# Patient Record
Sex: Female | Born: 1980
Health system: Southern US, Community
[De-identification: ages and names within clinical notes are randomized; demographics above are authoritative.]

## PROBLEM LIST (undated history)

## (undated) DIAGNOSIS — F32A Depression, unspecified: Secondary | ICD-10-CM

## (undated) DIAGNOSIS — R42 Dizziness and giddiness: Secondary | ICD-10-CM

## (undated) DIAGNOSIS — F319 Bipolar disorder, unspecified: Secondary | ICD-10-CM

## (undated) DIAGNOSIS — G4452 New daily persistent headache (NDPH): Secondary | ICD-10-CM

## (undated) DIAGNOSIS — F329 Major depressive disorder, single episode, unspecified: Secondary | ICD-10-CM

## (undated) HISTORY — PX: WISDOM TOOTH EXTRACTION: SHX21

## (undated) HISTORY — PX: APPENDECTOMY: SHX54

## (undated) HISTORY — DX: Bipolar disorder, unspecified: F31.9

## (undated) HISTORY — DX: Major depressive disorder, single episode, unspecified: F32.9

## (undated) HISTORY — DX: Dizziness and giddiness: R42

## (undated) HISTORY — DX: New daily persistent headache (ndph): G44.52

## (undated) HISTORY — DX: Depression, unspecified: F32.A

---

## 2012-12-01 ENCOUNTER — Emergency Department (HOSPITAL_COMMUNITY): Payer: BC Managed Care – PPO

## 2012-12-01 ENCOUNTER — Emergency Department (HOSPITAL_COMMUNITY)
Admission: EM | Admit: 2012-12-01 | Discharge: 2012-12-01 | Disposition: A | Discharge status: Home / Self Care | Payer: BC Managed Care – PPO | Attending: Emergency Medicine | Admitting: Emergency Medicine

## 2012-12-01 DIAGNOSIS — Z880 Allergy status to penicillin: Secondary | ICD-10-CM | POA: Insufficient documentation

## 2012-12-01 DIAGNOSIS — Z8739 Personal history of other diseases of the musculoskeletal system and connective tissue: Secondary | ICD-10-CM | POA: Insufficient documentation

## 2012-12-01 DIAGNOSIS — R55 Syncope and collapse: Secondary | ICD-10-CM

## 2012-12-01 DIAGNOSIS — F411 Generalized anxiety disorder: Secondary | ICD-10-CM | POA: Insufficient documentation

## 2012-12-01 DIAGNOSIS — Z79899 Other long term (current) drug therapy: Secondary | ICD-10-CM | POA: Insufficient documentation

## 2012-12-01 LAB — COMPREHENSIVE METABOLIC PANEL
AST: 18 U/L (ref 0–37)
Albumin: 4.7 g/dL (ref 3.5–5.2)
Alkaline Phosphatase: 55 U/L (ref 39–117)
BUN: 8 mg/dL (ref 6–23)
CO2: 24 mEq/L (ref 19–32)
Chloride: 103 mEq/L (ref 96–112)
Creatinine, Ser: 0.7 mg/dL (ref 0.50–1.10)
GFR calc non Af Amer: >90 mL/min (ref >90)
Potassium: 4.1 mEq/L (ref 3.5–5.1)
Total Bilirubin: 0.5 mg/dL (ref 0.3–1.2)

## 2012-12-01 LAB — URINALYSIS, ROUTINE W REFLEX MICROSCOPIC
Bilirubin Urine: NEGATIVE
Glucose, UA: NEGATIVE mg/dL
Hgb urine dipstick: NEGATIVE
Ketones, ur: 15 mg/dL — AB
Protein, ur: NEGATIVE mg/dL
pH: 7.5 (ref 5.0–8.0)

## 2012-12-01 LAB — CBC WITH DIFFERENTIAL/PLATELET
Basophils Absolute: 0 10*3/uL (ref 0.0–0.1)
Basophils Relative: 0 % (ref 0–1)
Eosinophils Relative: 0 % (ref 0–5)
HCT: 43.1 % (ref 36.0–46.0)
Hemoglobin: 15 g/dL (ref 12.0–15.0)
MCH: 27.9 pg (ref 26.0–34.0)
MCHC: 34.8 g/dL (ref 30.0–36.0)
MCV: 80.3 fL (ref 78.0–100.0)
Monocytes Absolute: 0.6 10*3/uL (ref 0.1–1.0)
Monocytes Relative: 6 % (ref 3–12)
Neutro Abs: 8 10*3/uL — ABNORMAL HIGH (ref 1.7–7.7)
RDW: 13.2 % (ref 11.5–15.5)

## 2012-12-01 LAB — T3, FREE: T3, Free: 3.6 pg/mL (ref 2.3–4.2)

## 2012-12-01 LAB — POCT I-STAT TROPONIN I: Troponin i, poc: 0.01 ng/mL (ref 0.00–0.08)

## 2012-12-01 MED ORDER — SODIUM CHLORIDE 0.9 % IV BOLUS (SEPSIS)
1000.0000 mL | Freq: Once | INTRAVENOUS | Status: AC
  Administered 2012-12-01: 1000 mL via INTRAVENOUS

## 2012-12-01 NOTE — ED Notes (Addendum)
Pt reports "passing out" this morning. Pt does not remember events of this morning. Pt husbands reports her waking up at 5am and pt does not remember events after waking up. Husband states that she was normal prior to 0615 when he left. Pt unsure if she hit her back or head. Pt does report some upper back pain. Pt states that she generally does not feel well now.

## 2012-12-01 NOTE — ED Provider Notes (Signed)
History     CSN: [id number]  Arrival date & time 12/01/12  4098   First MD Initiated Contact with Patient 12/01/12 0935      Chief Complaint  Patient presents with  . Loss of Consciousness    (Consider location/radiation/quality/duration/timing/severity/associated sxs/prior treatment) HPI  Tina Solis is a 32 y.o.female presenting to the ER with complaints of loss of consciousness. Her story is vague and complex. Her husband woke her up at 5:00am this morning, she started to get ready and last thing she remembers is doing her hair. Husband says he left for work around 6:20am and she was fine. The grandmother came over to pick up the kids and take them to school when their 84 year old told the grandma "mom spun around in circles and then fell down and I can't wake her up". The grandma called the husband to come home. At the time the next thing the husband or the patient can tell me is she called her husband at 7am and doesn't remember anything before going to bed last night. She denies any significant past medical history aside from starting treatment for her thyroid within the past month. She otherwise is healthy. She is tearful. The husband and patient deny psych history or history of anything like this happening before. Has had deep bone aches the last couple of months with weakness. Patient is currently awake and oriented. She is at baseline. No chest pain, n/v/d/fevers, head or neck injury, no focal weakness.   No past medical history on file.  No past surgical history on file.  No family history on file.  History  Substance Use Topics  . Smoking status: Not on file  . Smokeless tobacco: Not on file  . Alcohol Use: Not on file    OB History   No data available      Review of Systems  All other systems reviewed and are negative.   HPI negative unless as stated in HPI.   Allergies  Penicillins  Home Medications   Current Outpatient Rx  Name  Route  Sig  Dispense   Refill  . ALPRAZolam (XANAX) 1 MG tablet   Oral   Take 1 mg by mouth 2 (two) times daily as needed for sleep.         . methimazole (TAPAZOLE) 5 MG tablet   Oral   Take 5 mg by mouth 2 (two) times daily.           BP 113/77  Pulse 68  Temp(Src) 98.1 F (36.7 C) (Oral)  Resp 15  SpO2 100%  LMP 11/07/2012  Physical Exam  Nursing note and vitals reviewed. Constitutional: She is oriented to person, place, and time. She appears well-developed and well-nourished. No distress.  HENT:  Head: Normocephalic and atraumatic.  Eyes: Pupils are equal, round, and reactive to light.  Neck: Normal range of motion. Neck supple.  Cardiovascular: Normal rate and regular rhythm.   Pulmonary/Chest: Effort normal.  Abdominal: Soft.  Neurological: She is alert and oriented to person, place, and time. She has normal strength and normal reflexes. No cranial nerve deficit or sensory deficit. GCS eye subscore is 4. GCS verbal subscore is 5. GCS motor subscore is 6.  Skin: Skin is warm and dry.  Psychiatric: Her mood appears anxious. She does not exhibit a depressed mood. She expresses no homicidal and no suicidal ideation. She expresses no suicidal plans and no homicidal plans.    ED Course  Procedures (including critical care  time)  Labs Reviewed  URINALYSIS, ROUTINE W REFLEX MICROSCOPIC - Abnormal; Notable for the following:    Ketones, ur 15 (*)    All other components within normal limits  CBC WITH DIFFERENTIAL - Abnormal; Notable for the following:    RBC 5.37 (*)    Neutrophils Relative % 78 (*)    Neutro Abs 8.0 (*)    All other components within normal limits  CK - Abnormal; Notable for the following:    Total CK 184 (*)    All other components within normal limits  PREGNANCY, URINE  COMPREHENSIVE METABOLIC PANEL  TSH  T3, FREE  T4, FREE  POCT I-STAT TROPONIN I   Dg Chest 2 View  12/01/2012   *RADIOLOGY REPORT*  Clinical Data: Cough, syncope  CHEST - 2 VIEW  Comparison: None.   Findings: Cardiomediastinal silhouette is unremarkable.  No acute infiltrate or pleural effusion.  No pulmonary edema.  Bony thorax is unremarkable.  IMPRESSION: No active disease.   Original Report Authenticated By: Natasha Mead, M.D.   Ct Head Wo Contrast  12/01/2012   *RADIOLOGY REPORT*  Clinical Data:  Syncope  CT HEAD WITHOUT CONTRAST  Technique:  Contiguous axial images were obtained from the base of the skull through the vertex without contrast  Comparison:  10/18/2003  Findings:  The brain has a normal appearance without evidence for hemorrhage, acute infarction, hydrocephalus, or mass lesion.  There is no extra axial fluid collection.  The skull and paranasal sinuses are normal.  IMPRESSION: Normal CT of the head without contrast.   Original Report Authenticated By: [name], M.D.     1. Syncope       MDM   Date: 12/01/2012  Rate: 78  Rhythm:  sinus rhythm  QRS Axis: normal  Intervals: normal  ST/T Wave abnormalities: normal  Conduction Disutrbances:none  Narrative Interpretation:   Old EKG Reviewed: unchanged    10: 20 am Discussed case briefly with Dr. Bebe Shaggy. Will initiate comprehensive work-up.    1:29 pm- thorough evaluation done in the ED. DR. Bebe Shaggy has saw patient as well and no abnormality was found to explain the episode that happened today. Pt has PCP will have her follow-up for further treatment and eval, TSH and T3/T4 still pending. Pt at baseline at this time and she feels comfortable with the plan to discharge. Discussed with patient and husband that she should not drive until cleared by her PCP.  32 y.o.Tina Solis's evaluation in the Emergency Department is complete. It has been determined that no acute conditions requiring further emergency intervention are present at this time. The patient/guardian have been advised of the diagnosis and plan. We have discussed signs and symptoms that warrant return to the ED, such as changes or worsening in  symptoms.  Vital signs are stable at discharge. Filed Vitals:   12/01/12 1200  BP: 113/77  Pulse: 68  Temp:   Resp: 15    Patient/guardian has voiced understanding and agreed to follow-up with the PCP or specialist.       Dorthula Matas, PA-C 12/01/12 1330  Dorthula Matas, PA-C 12/01/12 1424

## 2012-12-02 NOTE — ED Provider Notes (Signed)
Medical screening examination/treatment/procedure(s) were conducted as a shared visit with non-physician practitioner(s) and myself.  I personally evaluated the patient during the encounter  EKG reviewed, no life threatening arrythmia noted Pt well appearing, improved, seen ambulating without difficulty Workup appropriate and appropriate for d/c home Discussed with patient/family, agreeable with plan   Joya Gaskins, MD 12/02/12 [phone]

## 2014-03-11 ENCOUNTER — Observation Stay: Payer: Self-pay | Admitting: Surgery

## 2014-03-11 LAB — CBC
HCT: 42.2 % (ref 35.0–47.0)
HGB: 14 g/dL (ref 12.0–16.0)
MCH: 27.5 pg (ref 26.0–34.0)
MCHC: 33 g/dL (ref 32.0–36.0)
MCV: 83 fL (ref 80–100)
Platelet: 160 10*3/uL (ref 150–440)
RBC: 5.07 10*6/uL (ref 3.80–5.20)
RDW: 13.2 % (ref 11.5–14.5)
WBC: 13.4 10*3/uL — ABNORMAL HIGH (ref 3.6–11.0)

## 2014-03-11 LAB — COMPREHENSIVE METABOLIC PANEL
ALBUMIN: 3.6 g/dL (ref 3.4–5.0)
ANION GAP: 8 (ref 7–16)
AST: 12 U/L — AB (ref 15–37)
Alkaline Phosphatase: 50 U/L
BUN: 9 mg/dL (ref 7–18)
Bilirubin,Total: 0.5 mg/dL (ref 0.2–1.0)
CHLORIDE: 105 mmol/L (ref 98–107)
CREATININE: 0.8 mg/dL (ref 0.60–1.30)
Calcium, Total: 8.2 mg/dL — ABNORMAL LOW (ref 8.5–10.1)
Co2: 24 mmol/L (ref 21–32)
EGFR (African American): >60
GLUCOSE: 89 mg/dL (ref 65–99)
OSMOLALITY: 272 (ref 275–301)
Potassium: 3.6 mmol/L (ref 3.5–5.1)
SGPT (ALT): 13 U/L — ABNORMAL LOW
Sodium: 137 mmol/L (ref 136–145)
Total Protein: 6.8 g/dL (ref 6.4–8.2)

## 2014-03-11 LAB — URINALYSIS, COMPLETE
BILIRUBIN, UR: NEGATIVE
Blood: NEGATIVE
GLUCOSE, UR: NEGATIVE mg/dL (ref 0–75)
KETONE: NEGATIVE
NITRITE: NEGATIVE
PH: 8 (ref 4.5–8.0)
Protein: NEGATIVE
RBC,UR: NONE SEEN /HPF (ref 0–5)
Specific Gravity: 1.01 (ref 1.003–1.030)
Squamous Epithelial: 7
WBC UR: 2 /HPF (ref 0–5)

## 2014-03-12 LAB — CBC WITH DIFFERENTIAL/PLATELET
Basophil #: 0 10*3/uL (ref 0.0–0.1)
Basophil %: 0.4 %
EOS ABS: 0.1 10*3/uL (ref 0.0–0.7)
EOS PCT: 1.6 %
HCT: 38.9 % (ref 35.0–47.0)
HGB: 13.1 g/dL (ref 12.0–16.0)
LYMPHS ABS: 2.1 10*3/uL (ref 1.0–3.6)
Lymphocyte %: 25.5 %
MCH: 28.1 pg (ref 26.0–34.0)
MCHC: 33.6 g/dL (ref 32.0–36.0)
MCV: 84 fL (ref 80–100)
Monocyte #: 0.5 x10 3/mm (ref 0.2–0.9)
Monocyte %: 6.6 %
NEUTROS ABS: 5.4 10*3/uL (ref 1.4–6.5)
NEUTROS PCT: 65.9 %
Platelet: 146 10*3/uL — ABNORMAL LOW (ref 150–440)
RBC: 4.66 10*6/uL (ref 3.80–5.20)
RDW: 13.1 % (ref 11.5–14.5)
WBC: 8.2 10*3/uL (ref 3.6–11.0)

## 2014-03-14 LAB — PATHOLOGY REPORT

## 2014-10-12 NOTE — H&P (Signed)
PATIENT NAME:  Tina Solis, Tina Solis MR#:  [id number] DATE OF BIRTH:  1980/06/26  DATE OF ADMISSION:  03/11/2014  CHIEF COMPLAINT: Right lower quadrant pain.   HISTORY OF PRESENT ILLNESS: This is a patient with approximately 24 hours of abdominal pain. She has never had an episode like this before. States that she was in her private medical doctor's office where the urinalysis failed to show any abnormalities, but she had a white blood cell count of 17,000 and was sent over for evaluation, where a CT scan was equivocal. She describes what she calls in her words "flu-like symptoms," with dizziness, shakiness, and a low-grade fever, 100.2. Denies any back pain, denies any dysuria, no hematuria, and points to the right lower quadrant where her pain is located. She has been afraid to have a bowel movement, has had what she calls "severe nausea" without emesis. I was asked to see the patient by Dr. Corky Downs  for her equivocal CT findings.   PAST MEDICAL HISTORY: Some sort of thyroid disease for which she takes methimazole but does not take it regularly, nor does she take it at the same dose that was recommended by her physician because it makes her feel unusual. She also has anxiety for which she takes Xanax.   MEDICATIONS: See chart.   ALLERGIES: PENICILLIN.   PAST SURGICAL HISTORY: Wisdom teeth.   SOCIAL HISTORY: The patient smokes tobacco, rarely drinks alcohol, and works in Therapist, art.   FAMILY HISTORY: Noncontributory.  REVIEW OF SYSTEMS: Ten-system review was performed and negative with the exception of that mentioned in the HPI.   PHYSICAL EXAMINATION:  GENERAL: Uncomfortable, somewhat agitated, anxious-appearing female patient.  HEENT: Shows no scleral icterus. Her conjunctivae appear injected and red as opposed to the normal pink color.  CHEST: Clear to auscultation.  CARDIAC: Regular rate and rhythm.  ABDOMEN: Soft, nondistended, nontympanitic. Tender in the right lower quadrant with a  negative Rovsing sign. No guarding, no rebound. No percussion tenderness.  EXTREMITIES: Without edema.  NEUROLOGIC: Grossly intact.  INTEGUMENT: Shows tattoos. No jaundice.   LABORATORY DATA: White blood cell count is 13, H and H of 14 and 42. Electrolytes are within normal limits. Urinalysis demonstrates 1+ leukocyte esterase, 1+ bacteria.  IMAGING: CT scan is personally reviewed. Upper limits of normal for the appendix, but no periappendiceal inflammation.   ASSESSMENT AND PLAN: This is a patient with somewhat unusual symptoms for appendicitis, although this could represent early appendicitis. My recommendations are to admit the patient to the hospital, hydrate her, control her pain and nausea, start intravenous antibiotics, and re-examine. Should she not progress in a positive manner, laparoscopy would be indicated. She and her family member were in agreement with this plan, as was Dr. Corky Downs, whom I discussed this with personally.   ____________________________ Jerrol Banana Burt Knack, MD rec:ST D: 03/11/2014 23:15:48 ET T: 03/11/2014 23:35:56 ET JOB#: [id number]  cc: Jerrol Banana. Burt Knack, MD, <Dictator> Florene Glen MD ELECTRONICALLY SIGNED 03/12/2014 6:57

## 2014-10-12 NOTE — H&P (Signed)
Subjective/Chief Complaint RLQ pain   History of Present Illness started 24 hrs ago no prior episode "flu like symptoms", dizziness, low grade fever severe nausea, no emesis no dysuria had clean urine and WBC 17k at PMD today   Past History PMH Thyroid and anxiety PSH wisdom teeth   Past Medical Health Smoking   Past Med/Surgical Hx:  thyroid: pt unsure if being treated for hyper or hypo thyroid  denies surgical history:   ALLERGIES:  Penicillin: Hives  Family and Social History:  Family History Non-Contributory   Social History positive  tobacco, positive tobacco (Greater than 1 year), cust service   + Tobacco Current (within 1 year)   Place of Living Home   Review of Systems:  Fever/Chills Yes   Cough No   Abdominal Pain Yes   Diarrhea No   Constipation No   Nausea/Vomiting Yes   SOB/DOE No   Chest Pain No   Dysuria No   Tolerating PT No   Tolerating Diet No  Nauseated   Medications/Allergies Reviewed Medications/Allergies reviewed   Physical Exam:  GEN uncomfortable, anxious   HEENT red conjunctivae   NECK supple   RESP normal resp effort  clear BS   CARD regular rate   ABD positive tenderness  soft  neg Rovsing's sign, no peritoneal signs   LYMPH negative neck   EXTR negative edema   SKIN normal to palpation   PSYCH alert, A+O to time, place, person, anxious   Lab Results: Hepatic:  21-Sep-15 19:49   Bilirubin, Total 0.5  Alkaline Phosphatase 50 (46-116 NOTE: New Reference Range 01/08/14)  SGPT (ALT)  13 (14-63 NOTE: New Reference Range 01/08/14)  SGOT (AST)  12  Total Protein, Serum 6.8  Albumin, Serum 3.6  Routine Chem:  21-Sep-15 19:49   Glucose, Serum 89  BUN 9  Creatinine (comp) 0.80  Sodium, Serum 137  Potassium, Serum 3.6  Chloride, Serum 105  CO2, Serum 24  Calcium (Total), Serum  8.2  Osmolality (calc) 272  eGFR (African American) >60  eGFR (Non-African American) >60 (eGFR values <43m/min/1.73 m2  may be an indication of chronic kidney disease (CKD). Calculated eGFR is useful in patients with stable renal function. The eGFR calculation will not be reliable in acutely ill patients when serum creatinine is changing rapidly. It is not useful in  patients on dialysis. The eGFR calculation may not be applicable to patients at the low and high extremes of body sizes, pregnant women, and vegetarians.)  Anion Gap 8  Routine UA:  21-Sep-15 20:37   Color (UA) Yellow  Clarity (UA) Hazy  Glucose (UA) Negative  Bilirubin (UA) Negative  Ketones (UA) Negative  Specific Gravity (UA) 1.010  Blood (UA) Negative  pH (UA) 8.0  Protein (UA) Negative  Nitrite (UA) Negative  Leukocyte Esterase (UA) 1+ (Result(s) reported on 11 Mar 2014 at 09:35PM.)  RBC (UA) NONE SEEN  WBC (UA) 2 /HPF  Bacteria (UA) 1+  Epithelial Cells (UA) 7 /HPF  Mucous (UA) PRESENT (Result(s) reported on 11 Mar 2014 at 09:35PM.)  Routine Hem:  21-Sep-15 19:49   WBC (CBC)  13.4  RBC (CBC) 5.07  Hemoglobin (CBC) 14.0  Hematocrit (CBC) 42.2  Platelet Count (CBC) 160 (Result(s) reported on 11 Mar 2014 at 08:18PM.)  MCV 83  MCH 27.5  MCHC 33.0  RDW 13.2   Radiology Results: CT:    21-Sep-15 21:32, CT Abdomen and Pelvis With Contrast  CT Abdomen and Pelvis With Contrast  REASON FOR EXAM:    (  1) rlq pain, leukocytiosis; (2) rlq pain,   leukocytosis  COMMENTS:       PROCEDURE: CT  - CT ABDOMEN / PELVIS  W  - Mar 11 2014  9:32PM     CLINICAL DATA:  Right lower quadrant pain, leukocytosis    EXAM:  CT ABDOMEN AND PELVIS WITH CONTRAST    TECHNIQUE:  Multidetector CT imaging of the abdomen and pelvis was performed  using the standard protocol following bolus administration of  intravenous contrast.  CONTRAST:  75 mL Isovue 300 IV    COMPARISON:  None.    FINDINGS:  Lower chest:  Lung bases are essentially clear.    Hepatobiliary: 5 mm probable cyst in the posterior segment right  hepatic lobe (series 2/  image 24).    Gallbladder is underdistended but unremarkable. No intrahepatic or  extrahepatic ductal dilatation.    Spleen: Within normal limits.  Pancreas: Within normal limits.    Stomach/Bowel: Stomach is mildly distended but unremarkable.    No evidence of bowel obstruction.    Appendix is at the upper limits of normal, measuring 8 mm, with a  mildly thickened wall (series 5/image 55) but without convincing  periappendiceal inflammatory changes (series 2/image 62).    Adrenals/urinary tract: Adrenal glands are unremarkable.    Multiple bilateral renal cysts, most of which are simple. However, a  1.6 x 1.3 cm lesion in the medial left upper kidney is hyperdense  (series 2/image 16), statistically likely reflecting a hemorrhagic  cyst in a patient of this age, but technically indeterminate.    Bladder is mildly thick-walled.    Vascular/Lymphatic: No evidence of abdominal aortic aneurysm.    No suspicious abdominopelvic lymphadenopathy.    Reproductive: Uterus is within normal limits.    Bilateral ovaries are unremarkable.    Musculoskeletal: Visualized osseous structures are within normal  limits.  Other: Small volume pelvic ascites.     IMPRESSION:  Prominent appendix with mildly thickened wall, but no convincing  periappendiceal inflammatory changes. This appearance is considered  equivocal/borderline. In the appropriate clinical setting, early  acute appendicitis is not excluded. If clinically equivocal,  consider short-term follow-up with repeat imaging as clinically  warranted.    Small volume pelvic ascites, likely physiologic.    Mildly thick-walled bladder, correlate for cystitis.    Multiple bilateral renal cysts, most of which are simple. 1.6 cm  cyst in the medial left upper kidney is hyperdense, statistically  likely reflecting a hemorrhagic cyst. Consider follow-up MRI abdomen  with/ without contrast in 6 months if clinically  warranted.      Electronically Signed    By: Julian Hy M.D.    On: 03/11/2014 22:16         Verified By: Julian Hy, M.D.,    Assessment/Admission Diagnosis poss early appendicitis but has "flu like symptoms", low grade fever and severe nausea. Not completely typical of appy and CT is equivocal. Blood and bacteria on UA rec admit hydrate abx reexamine   Electronic Signatures: Florene Glen (MD)  (Signed 21-Sep-15 23:05)  Authored: CHIEF COMPLAINT and HISTORY, PAST MEDICAL/SURGIAL HISTORY, ALLERGIES, FAMILY AND SOCIAL HISTORY, REVIEW OF SYSTEMS, PHYSICAL EXAM, LABS, Radiology, ASSESSMENT AND PLAN   Last Updated: 21-Sep-15 23:05 by Florene Glen (MD)

## 2014-10-12 NOTE — Op Note (Signed)
PATIENT NAME:  Tina Solis, Tina Solis MR#:  [id number] DATE OF BIRTH:  Jul 16, 1980  DATE OF PROCEDURE:  03/12/2014  PREOPERATIVE DIAGNOSIS: Acute appendicitis.   POSTOPERATIVE DIAGNOSIS:  Acute appendicitis.   PROCEDURE PERFORMED: Laparoscopic appendectomy.   SURGEON: Hortencia Conradi, M.D.   ASSISTANT: Surgical scrub technologist.   ANESTHESIA: General oroendotracheal.   FINDINGS: Suppurative appendicitis.   SPECIMENS: Appendix.   ESTIMATED BLOOD LOSS: 25 mL.   DESCRIPTION OF PROCEDURE: Informed consent, supine position, general endotracheal anesthesia, sterile prep and drape timeout was observed.   A 12-mm blunt Hassan trocar was placed through an open technique with stay sutures being passed through the fascia. Pneumoperitoneum was established. The patient was then positioned in head down right side up. A 5-mm bladeless trocar in the right upper quadrant and 5-mm bladeless trocar in the left lower quadrant. The appendix was identified at the confluence of the tinea, involved in the suppurative reaction. No evidence of frank perforation was identified. There was no pus within the abdomen. The appendix was elevated towards the anterior abdominal wall. A window was fashioned at the base of the appendix separating the mesoappendix and the base of the bowel. The mesoappendix was then sequentially taken with small bites utilizing the Harmonic scalpel. The confluence of the tinea was identified and the stapler was placed across the base of the appendix, submitting the specimen to pathology via an EndoCatch device. The right lower quadrant was irrigated with a total of 1 liter of normal saline and aspirated dry and hemostasis appeared to be adequate on the operative field. Inspection of the tubes and ovaries demonstrated no acute findings. Ports were then removed under direct visualization. The infraumbilical fascial defect being reapproximated with a figure-of-eight #0 Vicryl suture in vertical orientation,  existing stay sutures tied to each other. A total of 20 mL of 0.25% plain Marcaine was infiltrated along all skin and fascial incisions prior to closure. Then, 4-0 Vicryl subcuticular was applied to all skin edges followed by the application of benzoin, Steri-Strips, Telfa, and Tegaderm. The patient was then subsequently extubated and taken to the recovery room in stable and satisfactory condition by anesthesia services.    ____________________________ Jeannette How Marina Gravel, MD mab:lr D: 03/12/2014 17:07:23 ET T: 03/12/2014 20:55:22 ET JOB#: [id number]  cc: Elta Guadeloupe A. Marina Gravel, MD, <Dictator> Hortencia Conradi MD ELECTRONICALLY SIGNED 03/16/2014 16:41

## 2015-09-25 IMAGING — CT CT ABD-PELV W/ CM
2 of 4 series · 15 of 46 positions shown, 17 images · IV contrast (isovue)
Comparison: None.

CLINICAL DATA: Right lower quadrant pain, leukocytosis

EXAM:
CT ABDOMEN AND PELVIS WITH CONTRAST
TECHNIQUE: Multidetector CT imaging of the abdomen and pelvis was performed
using the standard protocol following bolus administration of
intravenous contrast.
CONTRAST:  75 mL Isovue 300 IV

[Series 2: routine abd pel with · axial · 0.61mm/px · z∈[-448,-98]mm · 12 of 81 slices shown, 14 images]
[im 7/81  soft-tissue]
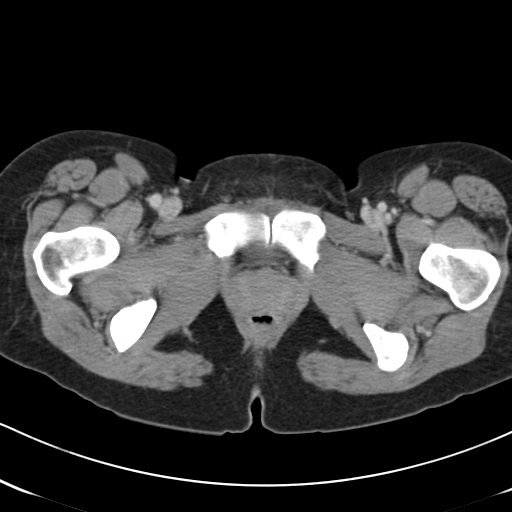
[im 7/81  bone]
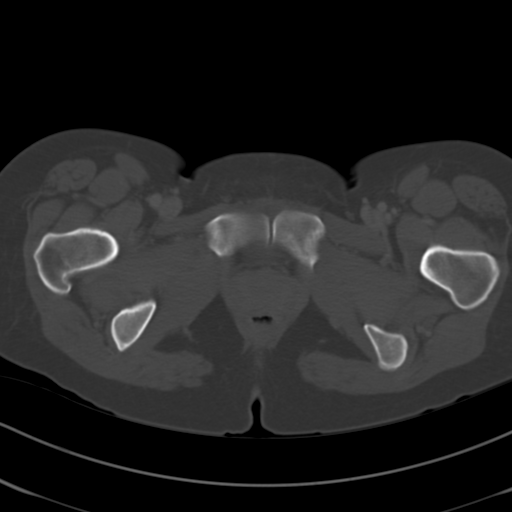
[im 13/81  soft-tissue]
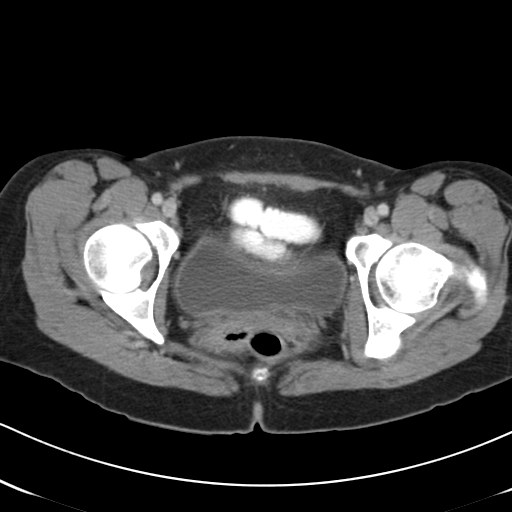
[im 20/81  soft-tissue]
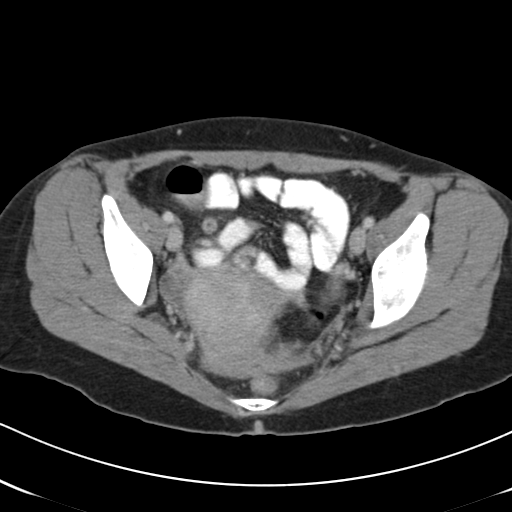
[im 26/81  soft-tissue]
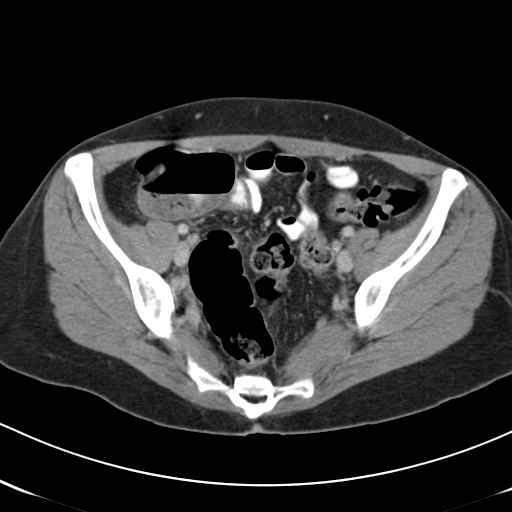
[im 33/81  soft-tissue]
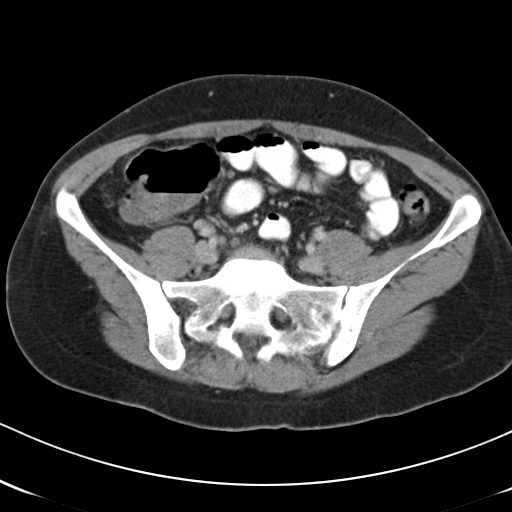
[im 39/81  soft-tissue]
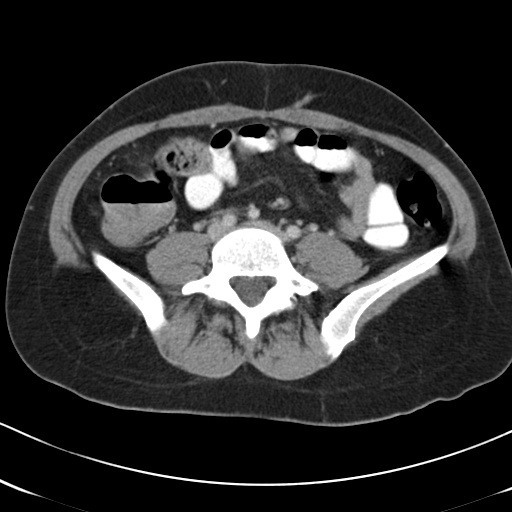
[im 45/81  soft-tissue]
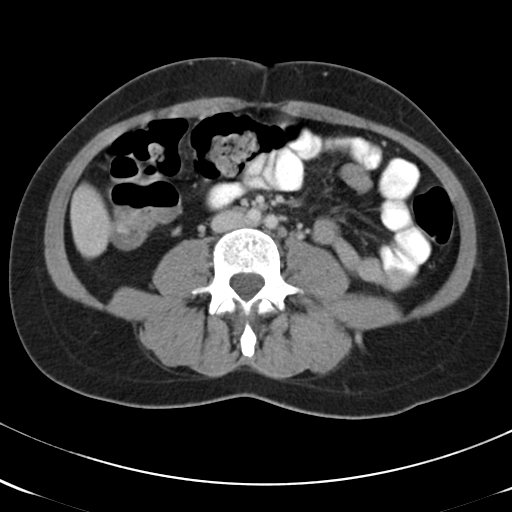
[im 52/81  soft-tissue]
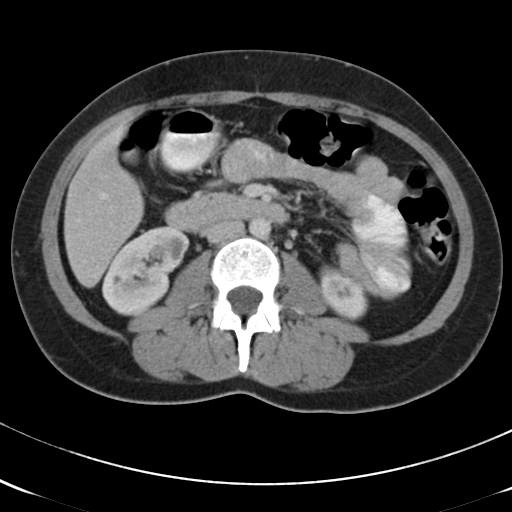
[im 58/81  soft-tissue]
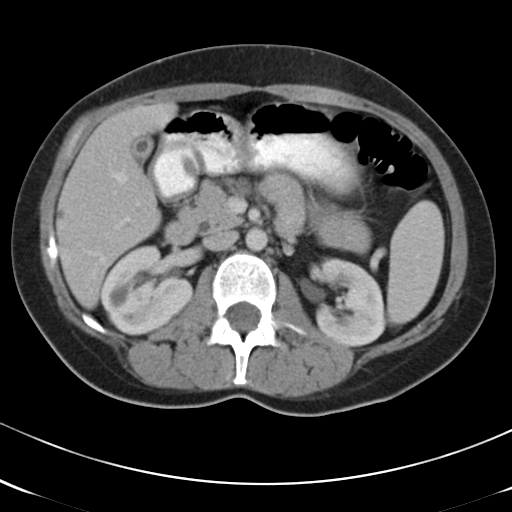
[im 58/81  bone]
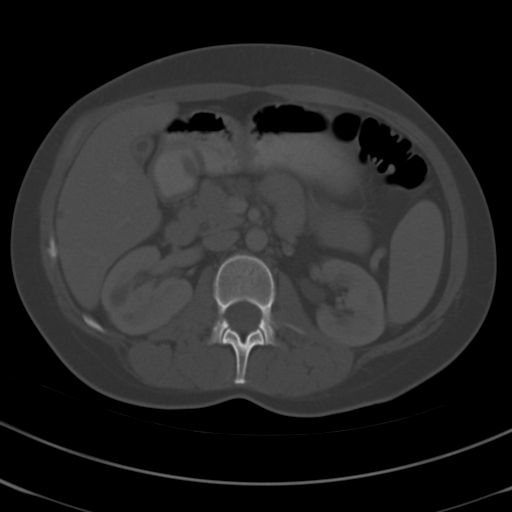
[im 65/81  soft-tissue]
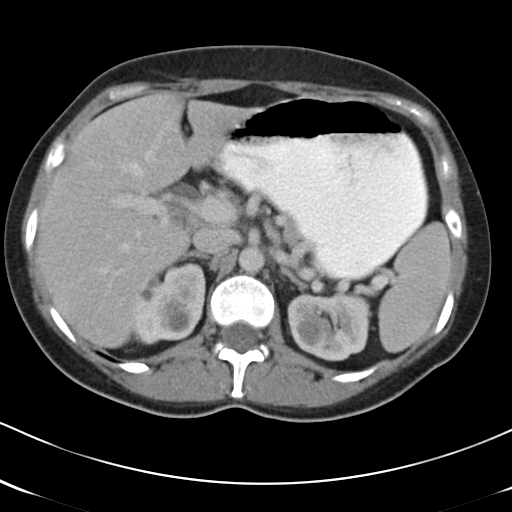
[im 71/81  soft-tissue]
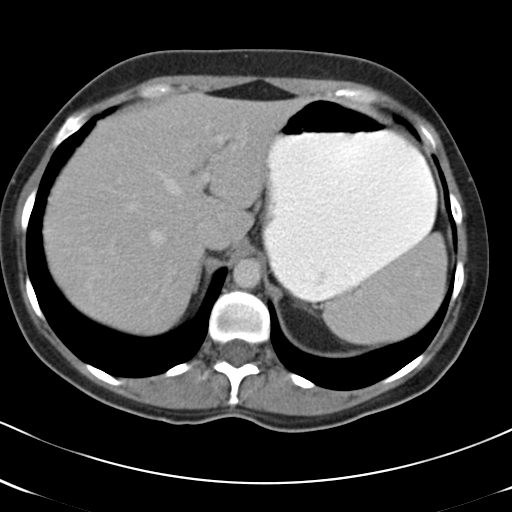
[im 77/81  soft-tissue]
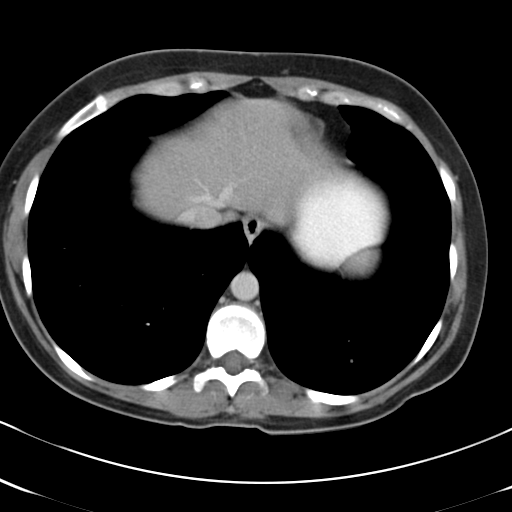

[Series 5: cor routine abd pel with · coronal · 0.60mm/px · 3 of 113 slices shown]
[im 38/113  soft-tissue]
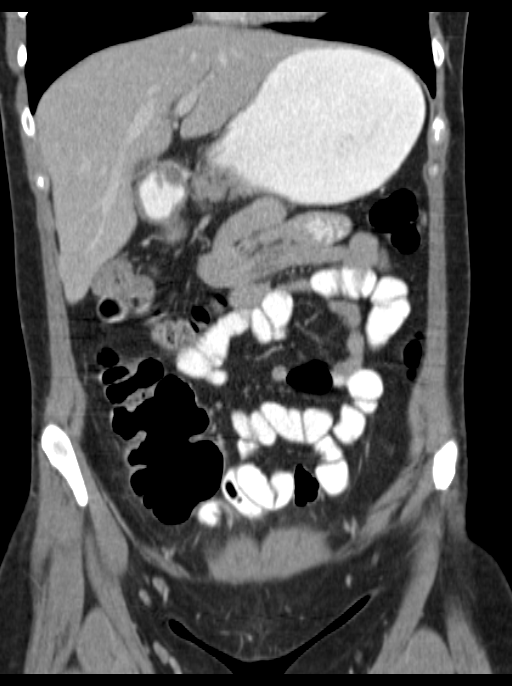
[im 50/113  soft-tissue]
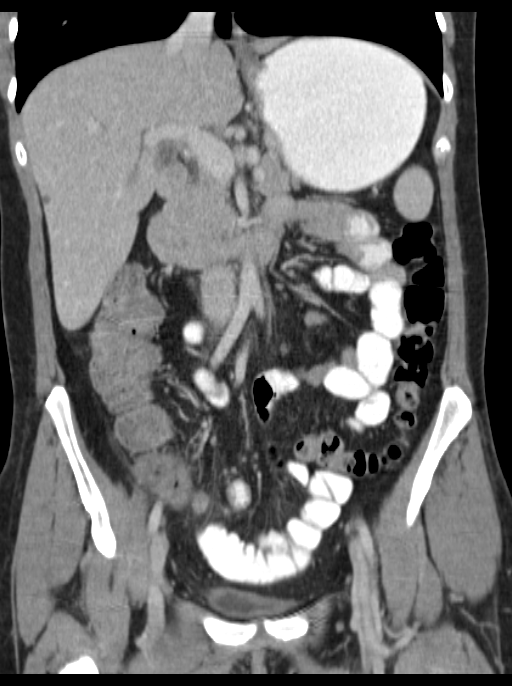
[im 63/113  soft-tissue]
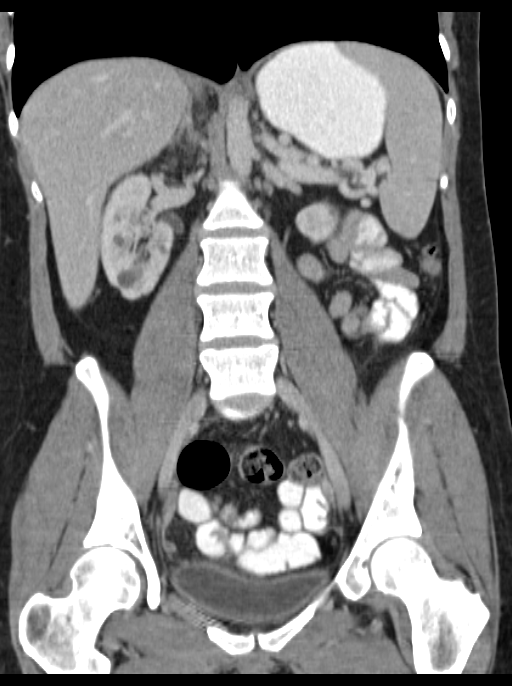

[15 of 46 positions shown; findings below may reference images not displayed]

FINDINGS: Lower chest:  Lung bases are essentially clear.

Hepatobiliary: 5 mm probable cyst in the posterior segment right
hepatic lobe (series 2/ image 24).

Gallbladder is underdistended but unremarkable. No intrahepatic or
extrahepatic ductal dilatation.

Spleen: Within normal limits.

Pancreas: Within normal limits.

Stomach/Bowel: Stomach is mildly distended but unremarkable.

No evidence of bowel obstruction.

Appendix is at the upper limits of normal, measuring 8 mm, with a
mildly thickened wall (series 5/image 55) but without convincing
periappendiceal inflammatory changes (series 2/image 62).

Adrenals/urinary tract: Adrenal glands are unremarkable.

Multiple bilateral renal cysts, most of which are simple. However, a
1.6 x 1.3 cm lesion in the medial left upper kidney is hyperdense
(series 2/image 16), statistically likely reflecting a hemorrhagic
cyst in a patient of this age, but technically indeterminate.

Bladder is mildly thick-walled.

Vascular/Lymphatic: No evidence of abdominal aortic aneurysm.

No suspicious abdominopelvic lymphadenopathy.

Reproductive: Uterus is within normal limits.

Bilateral ovaries are unremarkable.

Musculoskeletal: Visualized osseous structures are within normal
limits.

Other: Small volume pelvic ascites.
IMPRESSION: Prominent appendix with mildly thickened wall, but no convincing
periappendiceal inflammatory changes. This appearance is considered
equivocal/borderline. In the appropriate clinical setting, early
acute appendicitis is not excluded. If clinically equivocal,
consider short-term follow-up with repeat imaging as clinically
warranted.

Small volume pelvic ascites, likely physiologic.

Mildly thick-walled bladder, correlate for cystitis.

Multiple bilateral renal cysts, most of which are simple. 1.6 cm
cyst in the medial left upper kidney is hyperdense, statistically
likely reflecting a hemorrhagic cyst. Consider follow-up MRI abdomen
with/ without contrast in 6 months if clinically warranted.

## 2016-03-13 ENCOUNTER — Other Ambulatory Visit: Payer: Self-pay | Admitting: Otolaryngology

## 2016-03-13 DIAGNOSIS — J028 Acute pharyngitis due to other specified organisms: Secondary | ICD-10-CM

## 2016-03-22 ENCOUNTER — Ambulatory Visit
Admission: RE | Admit: 2016-03-22 | Discharge: 2016-03-22 | Disposition: A | Discharge status: Home / Self Care | Payer: PRIVATE HEALTH INSURANCE | Source: Ambulatory Visit | Attending: Otolaryngology | Admitting: Otolaryngology

## 2016-03-22 DIAGNOSIS — J028 Acute pharyngitis due to other specified organisms: Secondary | ICD-10-CM

## 2016-03-25 ENCOUNTER — Other Ambulatory Visit: Payer: Self-pay | Admitting: Otolaryngology

## 2016-03-25 DIAGNOSIS — E041 Nontoxic single thyroid nodule: Secondary | ICD-10-CM

## 2016-03-30 ENCOUNTER — Ambulatory Visit
Admission: RE | Admit: 2016-03-30 | Discharge: 2016-03-30 | Disposition: A | Discharge status: Home / Self Care | Payer: PRIVATE HEALTH INSURANCE | Source: Ambulatory Visit | Attending: Otolaryngology | Admitting: Otolaryngology

## 2016-03-30 ENCOUNTER — Other Ambulatory Visit (HOSPITAL_COMMUNITY)
Admission: RE | Admit: 2016-03-30 | Discharge: 2016-03-30 | Disposition: A | Discharge status: Home / Self Care | Payer: 59 | Source: Ambulatory Visit | Attending: Radiology | Admitting: Radiology

## 2016-03-30 DIAGNOSIS — E041 Nontoxic single thyroid nodule: Secondary | ICD-10-CM | POA: Insufficient documentation

## 2016-06-29 DIAGNOSIS — Z6821 Body mass index (BMI) 21.0-21.9, adult: Secondary | ICD-10-CM | POA: Diagnosis not present

## 2016-07-06 ENCOUNTER — Ambulatory Visit (INDEPENDENT_AMBULATORY_CARE_PROVIDER_SITE_OTHER): Payer: 59 | Admitting: Women's Health

## 2016-07-06 ENCOUNTER — Encounter: Payer: Self-pay | Admitting: Women's Health

## 2016-07-06 VITALS — BP 116/78 | Ht 62.5 in | Wt 123.0 lb

## 2016-07-06 DIAGNOSIS — Z13220 Encounter for screening for lipoid disorders: Secondary | ICD-10-CM | POA: Diagnosis not present

## 2016-07-06 DIAGNOSIS — E049 Nontoxic goiter, unspecified: Secondary | ICD-10-CM | POA: Diagnosis not present

## 2016-07-06 DIAGNOSIS — Z01419 Encounter for gynecological examination (general) (routine) without abnormal findings: Secondary | ICD-10-CM | POA: Diagnosis not present

## 2016-07-06 LAB — CBC WITH DIFFERENTIAL/PLATELET
BASOS ABS: 83 {cells}/uL (ref 0–200)
Basophils Relative: 1 %
EOS PCT: 3 %
Eosinophils Absolute: 249 cells/uL (ref 15–500)
HEMATOCRIT: 46.2 % — AB (ref 35.0–45.0)
Hemoglobin: 15.4 g/dL (ref 11.7–15.5)
Lymphocytes Relative: 34 %
Lymphs Abs: 2822 cells/uL (ref 850–3900)
MCH: 28.4 pg (ref 27.0–33.0)
MCHC: 33.3 g/dL (ref 32.0–36.0)
MCV: 85.1 fL (ref 80.0–100.0)
MONO ABS: 415 {cells}/uL (ref 200–950)
MPV: 10.3 fL (ref 7.5–12.5)
Monocytes Relative: 5 %
NEUTROS ABS: 4731 {cells}/uL (ref 1500–7800)
Neutrophils Relative %: 57 %
Platelets: 259 10*3/uL (ref 140–400)
RBC: 5.43 MIL/uL — AB (ref 3.80–5.10)
RDW: 13.6 % (ref 11.0–15.0)
WBC: 8.3 10*3/uL (ref 3.8–10.8)

## 2016-07-06 LAB — LIPID PANEL
CHOL/HDL RATIO: 4 ratio (ref <5.0)
CHOLESTEROL: 194 mg/dL (ref <200)
HDL: 48 mg/dL — AB (ref >50)
LDL Cholesterol: 120 mg/dL — ABNORMAL HIGH (ref <100)
Triglycerides: 128 mg/dL (ref <150)
VLDL: 26 mg/dL (ref <30)

## 2016-07-06 LAB — GLUCOSE, RANDOM: GLUCOSE: 86 mg/dL (ref 65–99)

## 2016-07-06 NOTE — Patient Instructions (Signed)
Health Maintenance, Female Introduction Adopting a healthy lifestyle and getting preventive care can go a long way to promote health and wellness. Talk with your health care provider about what schedule of regular examinations is right for you. This is a good chance for you to check in with your provider about disease prevention and staying healthy. In between checkups, there are plenty of things you can do on your own. Experts have done a lot of research about which lifestyle changes and preventive measures are most likely to keep you healthy. Ask your health care provider for more information. Weight and diet Eat a healthy diet  Be sure to include plenty of vegetables, fruits, low-fat dairy products, and lean protein.  Do not eat a lot of foods high in solid fats, added sugars, or salt.  Get regular exercise. This is one of the most important things you can do for your health.  Most adults should exercise for at least 150 minutes each week. The exercise should increase your heart rate and make you sweat (moderate-intensity exercise).  Most adults should also do strengthening exercises at least twice a week. This is in addition to the moderate-intensity exercise. Maintain a healthy weight  Body mass index (BMI) is a measurement that can be used to identify possible weight problems. It estimates body fat based on height and weight. Your health care provider can help determine your BMI and help you achieve or maintain a healthy weight.  For females 63 years of age and older:  A BMI below 18.5 is considered underweight.  A BMI of 18.5 to 24.9 is normal.  A BMI of 25 to 29.9 is considered overweight.  A BMI of 30 and above is considered obese. Watch levels of cholesterol and blood lipids  You should start having your blood tested for lipids and cholesterol at 34 years of age, then have this test every 5 years.  You may need to have your cholesterol levels checked more often if:  Your  lipid or cholesterol levels are high.  You are older than 34 years of age.  You are at high risk for heart disease. Cancer screening Lung Cancer  Lung cancer screening is recommended for adults 56-22 years old who are at high risk for lung cancer because of a history of smoking.  A yearly low-dose CT scan of the lungs is recommended for people who:  Currently smoke.  Have quit within the past 15 years.  Have at least a 30-pack-year history of smoking. A pack year is smoking an average of one pack of cigarettes a day for 1 year.  Yearly screening should continue until it has been 15 years since you quit.  Yearly screening should stop if you develop a health problem that would prevent you from having lung cancer treatment. Breast Cancer  Practice breast self-awareness. This means understanding how your breasts normally appear and feel.  It also means doing regular breast self-exams. Let your health care provider know about any changes, no matter how small.  If you are in your 20s or 30s, you should have a clinical breast exam (CBE) by a health care provider every 1-3 years as part of a regular health exam.  If you are 35 or older, have a CBE every year. Also consider having a breast X-ray (mammogram) every year.  If you have a family history of breast cancer, talk to your health care provider about genetic screening.  If you are at high risk for breast cancer,  talk to your health care provider about having an MRI and a mammogram every year.  Breast cancer gene (BRCA) assessment is recommended for women who have family members with BRCA-related cancers. BRCA-related cancers include:  Breast.  Ovarian.  Tubal.  Peritoneal cancers.  Results of the assessment will determine the need for genetic counseling and BRCA1 and BRCA2 testing. Cervical Cancer  Your health care provider may recommend that you be screened regularly for cancer of the pelvic organs (ovaries, uterus, and  vagina). This screening involves a pelvic examination, including checking for microscopic changes to the surface of your cervix (Pap test). You may be encouraged to have this screening done every 3 years, beginning at age 21.  For women ages 30-65, health care providers may recommend pelvic exams and Pap testing every 3 years, or they may recommend the Pap and pelvic exam, combined with testing for human papilloma virus (HPV), every 5 years. Some types of HPV increase your risk of cervical cancer. Testing for HPV may also be done on women of any age with unclear Pap test results.  Other health care providers may not recommend any screening for nonpregnant women who are considered low risk for pelvic cancer and who do not have symptoms. Ask your health care provider if a screening pelvic exam is right for you.  If you have had past treatment for cervical cancer or a condition that could lead to cancer, you need Pap tests and screening for cancer for at least 20 years after your treatment. If Pap tests have been discontinued, your risk factors (such as having a new sexual partner) need to be reassessed to determine if screening should resume. Some women have medical problems that increase the chance of getting cervical cancer. In these cases, your health care provider may recommend more frequent screening and Pap tests. Colorectal Cancer  This type of cancer can be detected and often prevented.  Routine colorectal cancer screening usually begins at 34 years of age and continues through 34 years of age.  Your health care provider may recommend screening at an earlier age if you have risk factors for colon cancer.  Your health care provider may also recommend using home test kits to check for hidden blood in the stool.  A small camera at the end of a tube can be used to examine your colon directly (sigmoidoscopy or colonoscopy). This is done to check for the earliest forms of colorectal  cancer.  Routine screening usually begins at age 50.  Direct examination of the colon should be repeated every 5-10 years through 34 years of age. However, you may need to be screened more often if early forms of precancerous polyps or small growths are found. Skin Cancer  Check your skin from head to toe regularly.  Tell your health care provider about any new moles or changes in moles, especially if there is a change in a mole's shape or color.  Also tell your health care provider if you have a mole that is larger than the size of a pencil eraser.  Always use sunscreen. Apply sunscreen liberally and repeatedly throughout the day.  Protect yourself by wearing long sleeves, pants, a wide-brimmed hat, and sunglasses whenever you are outside. Heart disease, diabetes, and high blood pressure  High blood pressure causes heart disease and increases the risk of stroke. High blood pressure is more likely to develop in:  People who have blood pressure in the high end of the normal range (130-139/85-89 mm Hg).    People who are overweight or obese.  People who are African American.  If you are 18-39 years of age, have your blood pressure checked every 3-5 years. If you are 40 years of age or older, have your blood pressure checked every year. You should have your blood pressure measured twice-once when you are at a hospital or clinic, and once when you are not at a hospital or clinic. Record the average of the two measurements. To check your blood pressure when you are not at a hospital or clinic, you can use:  An automated blood pressure machine at a pharmacy.  A home blood pressure monitor.  If you are between 55 years and 79 years old, ask your health care provider if you should take aspirin to prevent strokes.  Have regular diabetes screenings. This involves taking a blood sample to check your fasting blood sugar level.  If you are at a normal weight and have a low risk for diabetes,  have this test once every three years after 34 years of age.  If you are overweight and have a high risk for diabetes, consider being tested at a younger age or more often. Preventing infection Hepatitis B  If you have a higher risk for hepatitis B, you should be screened for this virus. You are considered at high risk for hepatitis B if:  You were born in a country where hepatitis B is common. Ask your health care provider which countries are considered high risk.  Your parents were born in a high-risk country, and you have not been immunized against hepatitis B (hepatitis B vaccine).  You have HIV or AIDS.  You use needles to inject street drugs.  You live with someone who has hepatitis B.  You have had sex with someone who has hepatitis B.  You get hemodialysis treatment.  You take certain medicines for conditions, including cancer, organ transplantation, and autoimmune conditions. Hepatitis C  Blood testing is recommended for:  Everyone born from 1945 through 1965.  Anyone with known risk factors for hepatitis C. Sexually transmitted infections (STIs)  You should be screened for sexually transmitted infections (STIs) including gonorrhea and chlamydia if:  You are sexually active and are younger than 34 years of age.  You are older than 34 years of age and your health care provider tells you that you are at risk for this type of infection.  Your sexual activity has changed since you were last screened and you are at an increased risk for chlamydia or gonorrhea. Ask your health care provider if you are at risk.  If you do not have HIV, but are at risk, it may be recommended that you take a prescription medicine daily to prevent HIV infection. This is called pre-exposure prophylaxis (PrEP). You are considered at risk if:  You are sexually active and do not regularly use condoms or know the HIV status of your partner(s).  You take drugs by injection.  You are sexually  active with a partner who has HIV. Talk with your health care provider about whether you are at high risk of being infected with HIV. If you choose to begin PrEP, you should first be tested for HIV. You should then be tested every 3 months for as long as you are taking PrEP. Pregnancy  If you are premenopausal and you may become pregnant, ask your health care provider about preconception counseling.  If you may become pregnant, take 400 to 800 micrograms (mcg) of folic acid   every day.  If you want to prevent pregnancy, talk to your health care provider about birth control (contraception). Osteoporosis and menopause  Osteoporosis is a disease in which the bones lose minerals and strength with aging. This can result in serious bone fractures. Your risk for osteoporosis can be identified using a bone density scan.  If you are 65 years of age or older, or if you are at risk for osteoporosis and fractures, ask your health care provider if you should be screened.  Ask your health care provider whether you should take a calcium or vitamin D supplement to lower your risk for osteoporosis.  Menopause may have certain physical symptoms and risks.  Hormone replacement therapy may reduce some of these symptoms and risks. Talk to your health care provider about whether hormone replacement therapy is right for you. Follow these instructions at home:  Schedule regular health, dental, and eye exams.  Stay current with your immunizations.  Do not use any tobacco products including cigarettes, chewing tobacco, or electronic cigarettes.  If you are pregnant, do not drink alcohol.  If you are breastfeeding, limit how much and how often you drink alcohol.  Limit alcohol intake to no more than 1 drink per day for nonpregnant women. One drink equals 12 ounces of beer, 5 ounces of wine, or 1 ounces of hard liquor.  Do not use street drugs.  Do not share needles.  Ask your health care provider for  help if you need support or information about quitting drugs.  Tell your health care provider if you often feel depressed.  Tell your health care provider if you have ever been abused or do not feel safe at home. This information is not intended to replace advice given to you by your health care provider. Make sure you discuss any questions you have with your health care provider. Document Released: 12/21/2010 Document Revised: 11/13/2015 Document Reviewed: 03/11/2015  2017 Elsevier Levonorgestrel intrauterine device (IUD) What is this medicine? LEVONORGESTREL IUD (LEE voe nor jes trel) is a contraceptive (birth control) device. The device is placed inside the uterus by a healthcare professional. It is used to prevent pregnancy. This device can also be used to treat heavy bleeding that occurs during your period. This medicine may be used for other purposes; ask your health care provider or pharmacist if you have questions. COMMON BRAND NAME(S): Kyleena, LILETTA, Mirena, Skyla What should I tell my health care provider before I take this medicine? They need to know if you have any of these conditions: -abnormal Pap smear -cancer of the breast, uterus, or cervix -diabetes -endometritis -genital or pelvic infection now or in the past -have more than one sexual partner or your partner has more than one partner -heart disease -history of an ectopic or tubal pregnancy -immune system problems -IUD in place -liver disease or tumor -problems with blood clots or take blood-thinners -seizures -use intravenous drugs -uterus of unusual shape -vaginal bleeding that has not been explained -an unusual or allergic reaction to levonorgestrel, other hormones, silicone, or polyethylene, medicines, foods, dyes, or preservatives -pregnant or trying to get pregnant -breast-feeding How should I use this medicine? This device is placed inside the uterus by a health care professional. Talk to your  pediatrician regarding the use of this medicine in children. Special care may be needed. Overdosage: If you think you have taken too much of this medicine contact a poison control center or emergency room at once. NOTE: This medicine is only for you.   Do not share this medicine with others. What if I miss a dose? This does not apply. Depending on the brand of device you have inserted, the device will need to be replaced every 3 to 5 years if you wish to continue using this type of birth control. What may interact with this medicine? Do not take this medicine with any of the following medications: -amprenavir -bosentan -fosamprenavir This medicine may also interact with the following medications: -aprepitant -barbiturate medicines for inducing sleep or treating seizures -bexarotene -griseofulvin -medicines to treat seizures like carbamazepine, ethotoin, felbamate, oxcarbazepine, phenytoin, topiramate -modafinil -pioglitazone -rifabutin -rifampin -rifapentine -some medicines to treat HIV infection like atazanavir, indinavir, lopinavir, nelfinavir, tipranavir, ritonavir -St. John's wort -warfarin This list may not describe all possible interactions. Give your health care provider a list of all the medicines, herbs, non-prescription drugs, or dietary supplements you use. Also tell them if you smoke, drink alcohol, or use illegal drugs. Some items may interact with your medicine. What should I watch for while using this medicine? Visit your doctor or health care professional for regular check ups. See your doctor if you or your partner has sexual contact with others, becomes HIV positive, or gets a sexual transmitted disease. This product does not protect you against HIV infection (AIDS) or other sexually transmitted diseases. You can check the placement of the IUD yourself by reaching up to the top of your vagina with clean fingers to feel the threads. Do not pull on the threads. It is a good  habit to check placement after each menstrual period. Call your doctor right away if you feel more of the IUD than just the threads or if you cannot feel the threads at all. The IUD may come out by itself. You may become pregnant if the device comes out. If you notice that the IUD has come out use a backup birth control method like condoms and call your health care provider. Using tampons will not change the position of the IUD and are okay to use during your period. This IUD can be safely scanned with magnetic resonance imaging (MRI) only under specific conditions. Before you have an MRI, tell your healthcare provider that you have an IUD in place, and which type of IUD you have in place. What side effects may I notice from receiving this medicine? Side effects that you should report to your doctor or health care professional as soon as possible: -allergic reactions like skin rash, itching or hives, swelling of the face, lips, or tongue -fever, flu-like symptoms -genital sores -high blood pressure -no menstrual period for 6 weeks during use -pain, swelling, warmth in the leg -pelvic pain or tenderness -severe or sudden headache -signs of pregnancy -stomach cramping -sudden shortness of breath -trouble with balance, talking, or walking -unusual vaginal bleeding, discharge -yellowing of the eyes or skin Side effects that usually do not require medical attention (report to your doctor or health care professional if they continue or are bothersome): -acne -breast pain -change in sex drive or performance -changes in weight -cramping, dizziness, or faintness while the device is being inserted -headache -irregular menstrual bleeding within first 3 to 6 months of use -nausea This list may not describe all possible side effects. Call your doctor for medical advice about side effects. You may report side effects to FDA at 1-800-FDA-1088. Where should I keep my medicine? This does not  apply. NOTE: This sheet is a summary. It may not cover all possible information. If you have  questions about this medicine, talk to your doctor, pharmacist, or health care provider.  2017 Elsevier/Gold Standard (2015-11-28 13:46:37)

## 2016-07-06 NOTE — Progress Notes (Signed)
Tina Solis 12/15/1980 [id number]    History:    Presents for annual exam.  Monthly cycle/infrequent intercourse poor relationship with husband, no infidelity. Normal Pap history overdue. Goiter negative thyroid biopsy 03/2016 scheduled for lobectomy. Smoker several cigarettes daily.  Past medical history, past surgical history, family history and social history were all reviewed and documented in the EPIC chart. Desk job/marketing. Children ages 34 and 26 both doing well. Parents healthy.  ROS:  A ROS was performed and pertinent positives and negatives are included.  Exam:  Vitals:   07/06/16 1017  BP: 116/78  Weight: 123 lb (55.8 kg)  Height: 5' 2.5" (1.588 m)   Body mass index is 22.14 kg/m.   General appearance:  Normal Thyroid:  Symmetrical, normal in size, without palpable masses or nodularity. Respiratory  Auscultation:  Clear without wheezing or rhonchi Cardiovascular  Auscultation:  Regular rate, without rubs, murmurs or gallops  Edema/varicosities:  Not grossly evident Abdominal  Soft,nontender, without masses, guarding or rebound.  Liver/spleen:  No organomegaly noted  Hernia:  None appreciated  Skin  Inspection:  Grossly normal   Breasts: Examined lying and sitting.     Right: Without masses, retractions, discharge or axillary adenopathy.     Left: Without masses, retractions, discharge or axillary adenopathy. Gentitourinary   Inguinal/mons:  Normal without inguinal adenopathy  External genitalia:  Normal  BUS/Urethra/Skene's glands:  Normal  Vagina:  Normal  Cervix:  Normal  Uterus:   normal in size, shape and contour.  Midline and mobile  Adnexa/parametria:     Rt: Without masses or tenderness.   Lt: Without masses or tenderness.  Anus and perineum: Normal  Digital rectal exam: Normal sphincter tone without palpated masses or tenderness  Assessment/Plan:  36 y.o. MWF  for annual exam with no complaints.  Monthly cycle/rare intercourse/marital  stress Contraception management Goiter/lobectomy scheduled Smoker several cigarettes daily  Plan: Contraception options reviewed Mirena IUD information given and reviewed slight risk for infection, perforation. Placed with cycle, will check coverage and have Dr. Phineas Real place with next cycle. Counseling encouraged, Sonda Primes name and number given. SBEs, exercise, calcium rich diet, vitamin D 1000 daily encouraged. CBC, glucose, lipid panel, UA, Pap with HR HPV typing, new screening guidelines reviewed.    Huel Cote [facility], 10:58 AM 07/06/2016

## 2016-07-07 LAB — URINALYSIS W MICROSCOPIC + REFLEX CULTURE
Bacteria, UA: NONE SEEN [HPF]
Bilirubin Urine: NEGATIVE
CASTS: NONE SEEN [LPF]
Crystals: NONE SEEN [HPF]
Glucose, UA: NEGATIVE
HGB URINE DIPSTICK: NEGATIVE
Ketones, ur: NEGATIVE
Leukocytes, UA: NEGATIVE
NITRITE: NEGATIVE
PH: 5.5 (ref 5.0–8.0)
PROTEIN: NEGATIVE
Specific Gravity, Urine: 1.01 (ref 1.001–1.035)
YEAST: NONE SEEN [HPF]

## 2016-07-08 LAB — PAP, TP IMAGING W/ HPV RNA, RFLX HPV TYPE 16,18/45: HPV mRNA, High Risk: NOT DETECTED

## 2016-07-08 LAB — URINE CULTURE: ORGANISM ID, BACTERIA: NO GROWTH

## 2016-07-16 ENCOUNTER — Encounter: Payer: Self-pay | Admitting: Gynecology

## 2016-07-16 ENCOUNTER — Ambulatory Visit (INDEPENDENT_AMBULATORY_CARE_PROVIDER_SITE_OTHER): Payer: 59 | Admitting: Gynecology

## 2016-07-16 VITALS — BP 118/76

## 2016-07-16 DIAGNOSIS — Z30430 Encounter for insertion of intrauterine contraceptive device: Secondary | ICD-10-CM

## 2016-07-16 HISTORY — PX: INTRAUTERINE DEVICE INSERTION: SHX323

## 2016-07-16 NOTE — Patient Instructions (Signed)
Intrauterine Device Insertion Most often, an intrauterine device (IUD) is inserted into the uterus to prevent pregnancy. There are 2 types of IUDs available:  Copper IUD-This type of IUD creates an environment that is not favorable to sperm survival. The mechanism of action of the copper IUD is not known for certain. It can stay in place for 10 years.  Hormone IUD-This type of IUD contains the hormone progestin (synthetic progesterone). The progestin thickens the cervical mucus and prevents sperm from entering the uterus, and it also thins the uterine lining. There is no evidence that the hormone IUD prevents implantation. One hormone IUD can stay in place for up to 5 years, and a different hormone IUD can stay in place for up to 3 years. An IUD is the most cost-effective birth control if left in place for the full duration. It may be removed at any time. LET YOUR HEALTH CARE PROVIDER KNOW ABOUT:  Any allergies you have.  All medicines you are taking, including vitamins, herbs, eye drops, creams, and over-the-counter medicines.  Previous problems you or members of your family have had with the use of anesthetics.  Any blood disorders you have.  Previous surgeries you have had.  Possibility of pregnancy.  Medical conditions you have. RISKS AND COMPLICATIONS  Generally, intrauterine device insertion is a safe procedure. However, as with any procedure, complications can occur. Possible complications include:  Accidental puncture (perforation) of the uterus.  Accidental placement of the IUD either in the muscle layer of the uterus (myometrium) or outside the uterus. If this happens, the IUD can be found essentially floating around the bowels and must be taken out surgically.  The IUD may fall out of the uterus (expulsion). This is more common in women who have recently had a child.   Pregnancy in the fallopian tube (ectopic).  Pelvic inflammatory disease (PID), which is infection of  the uterus and fallopian tubes. The risk of PID is slightly increased in the first 20 days after the IUD is placed, but the overall risk is still very low. BEFORE THE PROCEDURE  Schedule the IUD insertion for when you will have your menstrual period or right after, to make sure you are not pregnant. Placement of the IUD is better tolerated shortly after a menstrual cycle.  You may need to take tests or be examined to make sure you are not pregnant.  You may be required to take a pregnancy test.  You may be required to get checked for sexually transmitted infections (STIs) prior to placement. Placing an IUD in someone who has an infection can make the infection worse.  You may be given a pain reliever to take 1 or 2 hours before the procedure.  An exam will be performed to determine the size and position of your uterus.  Ask your health care provider about changing or stopping your regular medicines. PROCEDURE   A tool (speculum) is placed in the vagina. This allows your health care provider to see the lower part of the uterus (cervix).  The cervix is prepped with a medicine that lowers the risk of infection.  You may be given a medicine to numb each side of the cervix (intracervical or paracervical block). This is used to block and control any discomfort with insertion.  A tool (uterine sound) is inserted into the uterus to determine the length of the uterine cavity and the direction the uterus may be tilted.  A slim instrument (IUD inserter) is inserted through the cervical   canal and into your uterus.  The IUD is placed in the uterine cavity and the insertion device is removed.  The nylon string that is attached to the IUD and used for eventual IUD removal is trimmed. It is trimmed so that it lays high in the vagina, just outside the cervix. AFTER THE PROCEDURE  You may have bleeding after the procedure. This is normal. It varies from light spotting for a few days to menstrual-like  bleeding.  You may have mild cramping. This information is not intended to replace advice given to you by your health care provider. Make sure you discuss any questions you have with your health care provider. Document Released: 02/03/2011 Document Revised: 03/28/2013 Document Reviewed: 11/26/2012 Elsevier Interactive Patient Education  2017 Elsevier Inc.  

## 2016-07-16 NOTE — Progress Notes (Signed)
    Riane Arceo [birth year] [id number]        36 y.o.  G2P2  presents for Mirena IUD placement. She has read through the booklet, has no contraindications and signed the consent form. Previously has spoken to [place] about this.  She currently is on a normal menses.  I reviewed the insertional process with her as well as the risks to include infection, either immediate or long-term, uterine perforation or migration requiring surgery to remove, other complications such as pain, hormonal side effects, infertility and possibility of failure with subsequent pregnancy.   Exam with Caryn Bee assistant Vitals:   07/16/16 1207  BP: 118/76    Pelvic: External BUS vagina normal. Cervix normal with menses flow. Uterus anteverted normal size shape contour midline mobile nontender. Adnexa without masses or tenderness.  Procedure: The cervix was cleansed with Betadine, anterior lip grasped with a single-tooth tenaculum, the uterus was sounded and a Mirena IUD was placed according to manufacturer's recommendations without difficulty. The strings were trimmed. The patient tolerated well and will follow up in one month for a postinsertional check.  Lot number:  [id number]    Anastasio Auerbach MD, 12:27 PM 07/16/2016

## 2016-07-22 DIAGNOSIS — D440 Neoplasm of uncertain behavior of thyroid gland: Secondary | ICD-10-CM | POA: Diagnosis not present

## 2016-07-27 ENCOUNTER — Ambulatory Visit: Payer: 59 | Admitting: Gynecology

## 2016-08-02 ENCOUNTER — Ambulatory Visit: Payer: Self-pay | Admitting: Otolaryngology

## 2016-08-02 NOTE — H&P (Signed)
PREOPERATIVE H&P  Chief Complaint: Enlarging right thyroid nodule  HPI: Tina Solis is a 37 y.o. female who presents for evaluation of enlarged right thyroid nodule that measures 5.5 x 5 cm in size and has increased in size. She has a sensation of a lump in her throat when she swallows. FNA has been benign. She's taken to the OR for right thyroid lobectomy.  Past Medical History:  Diagnosis Date  . Depression    Past Surgical History:  Procedure Laterality Date  . APPENDECTOMY    . INTRAUTERINE DEVICE INSERTION  07/16/2016   Mirena  . WISDOM TOOTH EXTRACTION     Social History   Social History  . Marital status: Married    Spouse name: N/A  . Number of children: N/A  . Years of education: N/A   Social History Main Topics  . Smoking status: Current Every Day Smoker    Packs/day: 0.50    Types: Cigarettes  . Smokeless tobacco: Never Used  . Alcohol use Yes     Comment: SOCIAL  . Drug use: Unknown  . Sexual activity: Yes    Partners: Male    Birth control/ protection: IUD     Comment: 1ST INTERCOURSE- 82, PARTNERS- 9 Mirena IUD 06/2016   Other Topics Concern  . Not on file   Social History Narrative  . No narrative on file   Family History  Problem Relation Age of Onset  . Diabetes Father    Allergies  Allergen Reactions  . Penicillins Hives and Itching    Has patient had a PCN reaction causing immediate rash, facial/tongue/throat swelling, SOB or lightheadedness with hypotension:Yes Has patient had a PCN reaction causing severe rash involving mucus membranes or skin necrosis: Yes Has patient had a PCN reaction that required hospitalization:No Has patient had a PCN reaction occurring within the last 10 years:No If all of the above answers are "NO", then may proceed with Cephalosporin use.    Prior to Admission medications   Medication Sig Start Date End Date Taking? Authorizing Provider  ibuprofen (ADVIL,MOTRIN) 200 MG tablet Take 400 mg by mouth every 8  (eight) hours as needed (for pain.).   Yes Historical Provider, MD  lurasidone (LATUDA) 20 MG TABS tablet Take 20 mg by mouth at bedtime.    Yes Historical Provider, MD     Positive ROS: per HPI  All other systems have been reviewed and were otherwise negative with the exception of those mentioned in the HPI and as above.  Physical Exam: There were no vitals filed for this visit.  General: Alert, no acute distress Oral: Normal oral mucosa and tonsils Nasal: Clear nasal passages Neck: No palpable adenopathy or enlarged right thyroid lobe that protrudes externally 2-3 cm Ear: Ear canal is clear with normal appearing TMs Cardiovascular: Regular rate and rhythm, no murmur.  Respiratory: Clear to auscultation Neurologic: Alert and oriented x 3   Assessment/Plan: DYSPHAGIA Plan for Procedure(s): RIGHT THYROIDECTOMY   Melony Overly, MD 08/02/2016 10:40 AM

## 2016-08-03 ENCOUNTER — Encounter (HOSPITAL_COMMUNITY)
Admission: RE | Admit: 2016-08-03 | Discharge: 2016-08-03 | Disposition: A | Discharge status: Home / Self Care | Payer: 59 | Source: Ambulatory Visit | Attending: Otolaryngology | Admitting: Otolaryngology

## 2016-08-03 ENCOUNTER — Ambulatory Visit (HOSPITAL_COMMUNITY)
Admission: RE | Admit: 2016-08-03 | Discharge: 2016-08-03 | Disposition: A | Discharge status: Home / Self Care | Payer: 59 | Source: Ambulatory Visit | Attending: Anesthesiology | Admitting: Anesthesiology

## 2016-08-03 ENCOUNTER — Encounter (HOSPITAL_COMMUNITY): Payer: Self-pay

## 2016-08-03 DIAGNOSIS — E049 Nontoxic goiter, unspecified: Secondary | ICD-10-CM | POA: Insufficient documentation

## 2016-08-03 DIAGNOSIS — Z01812 Encounter for preprocedural laboratory examination: Secondary | ICD-10-CM | POA: Diagnosis not present

## 2016-08-03 DIAGNOSIS — Z01818 Encounter for other preprocedural examination: Secondary | ICD-10-CM | POA: Insufficient documentation

## 2016-08-03 LAB — COMPREHENSIVE METABOLIC PANEL
ALK PHOS: 46 U/L (ref 38–126)
ALT: 15 U/L (ref 14–54)
AST: 25 U/L (ref 15–41)
Albumin: 4.2 g/dL (ref 3.5–5.0)
Anion gap: 11 (ref 5–15)
BILIRUBIN TOTAL: 0.7 mg/dL (ref 0.3–1.2)
BUN: 8 mg/dL (ref 6–20)
CALCIUM: 9.4 mg/dL (ref 8.9–10.3)
CO2: 23 mmol/L (ref 22–32)
CREATININE: 0.86 mg/dL (ref 0.44–1.00)
Chloride: 103 mmol/L (ref 101–111)
Glucose, Bld: 83 mg/dL (ref 65–99)
Potassium: 4.3 mmol/L (ref 3.5–5.1)
Sodium: 137 mmol/L (ref 135–145)
Total Protein: 6.8 g/dL (ref 6.5–8.1)

## 2016-08-03 LAB — CBC
HCT: 47.7 % — ABNORMAL HIGH (ref 36.0–46.0)
Hemoglobin: 16 g/dL — ABNORMAL HIGH (ref 12.0–15.0)
MCH: 28 pg (ref 26.0–34.0)
MCHC: 33.5 g/dL (ref 30.0–36.0)
MCV: 83.4 fL (ref 78.0–100.0)
PLATELETS: 227 10*3/uL (ref 150–400)
RBC: 5.72 MIL/uL — AB (ref 3.87–5.11)
RDW: 13.3 % (ref 11.5–15.5)
WBC: 12.1 10*3/uL — AB (ref 4.0–10.5)

## 2016-08-03 LAB — HCG, SERUM, QUALITATIVE: PREG SERUM: NEGATIVE

## 2016-08-03 NOTE — Pre-Procedure Instructions (Signed)
Tina Solis  08/03/2016      [id number] Tina Solis, Tina Solis [place] [address] Phone: [phone] Fax: [phone]  [place] [place] [address] [place] [address] Phone: [phone] Fax: [phone]    Your procedure is scheduled on Friday, February 16.  Report to [facility] Admitting at 5:30 AM               Your surgery or procedure is scheduled for 7:30 AM   Call this number if you have problems the morning of surgery: [phone]               For any other questions, please call [phone], Monday - Friday 8 AM - 4 PM.     Remember:  Do not eat food or drink liquids after midnight Thursday, February 15.  Take these medicines the morning of surgery with A SIP OF WATER :  None.                   STOP taking Aspirin, Aspirin Products (Goody Powder, Excedrin Migraine), Ibuprofen (Advil), Naproxen (Aleve), Vitamins and Herbal Products (ie Fish Oil)                    [place]- Preparing For Surgery  Before surgery, you can play an important role. Because skin is not sterile, your skin needs to be as free of germs as possible. You can reduce the number of germs on your skin by washing with CHG (chlorahexidine gluconate) Soap before surgery.  CHG is an antiseptic cleaner which kills germs and bonds with the skin to continue killing germs even after washing.  Please do not use if you have an allergy to CHG or antibacterial soaps. If your skin becomes reddened/irritated stop using the CHG.  Do not shave (including legs and underarms) for at least 48 hours prior to first CHG shower. It is OK to shave your face.  Please follow these instructions carefully.   1. Shower the NIGHT BEFORE SURGERY and the MORNING OF SURGERY with CHG.   2. If you chose to wash your hair, wash your hair first as usual with your normal  shampoo.  3. After you shampoo, rinse your hair and body thoroughly to remove the shampoo.  4. Use CHG as you would any other liquid soap. You can apply CHG directly to the skin and wash gently with a scrungie or a clean washcloth.   5. Apply the CHG Soap to your body ONLY FROM THE NECK DOWN.  Do not use on open wounds or open sores. Avoid contact with your eyes, ears, mouth and genitals (private parts). Wash genitals (private parts) with your normal soap.  6. Wash thoroughly, paying special attention to the area where your surgery will be performed.  7. Thoroughly rinse your body with warm water from the neck down.  8. DO NOT shower/wash with your normal soap after using and rinsing off the CHG Soap.  9. Pat yourself dry with a CLEAN TOWEL.   10. Wear CLEAN PAJAMAS   11. Place CLEAN SHEETS on your bed the night of your first shower and DO NOT SLEEP WITH PETS.  Day of Surgery: Do not apply any deodorants/lotions. Please wear clean clothes to the hospital/surgery center.    Do not wear jewelry, make-up or nail polish.  Do not  wear lotions, powders, or perfumes, or deodorant.  Do not shave 48 hours prior to surgery.    Do not bring valuables to the hospital.  [facility] is not responsible for any belongings or valuables.  Contacts, dentures or bridgework may not be worn into surgery.  Leave your suitcase in the car.  After surgery it may be brought to your room.  For patients admitted to the hospital, discharge time will be determined by your treatment team.  Patients discharged the day of surgery will not be allowed to drive home.   Name and phone number of your driver:  -  Special instructions: -  Please read over the following fact sheets that you were given. Coughing and Deep Breathing and Pain Booklet

## 2016-08-06 ENCOUNTER — Ambulatory Visit (HOSPITAL_COMMUNITY): Payer: 59 | Admitting: Anesthesiology

## 2016-08-06 ENCOUNTER — Encounter (HOSPITAL_COMMUNITY)
Admission: RE | Disposition: A | Discharge status: Home / Self Care | Payer: Self-pay | Source: Ambulatory Visit | Attending: Otolaryngology

## 2016-08-06 ENCOUNTER — Encounter (HOSPITAL_COMMUNITY): Payer: Self-pay | Admitting: *Deleted

## 2016-08-06 ENCOUNTER — Observation Stay (HOSPITAL_COMMUNITY)
Admission: RE | Admit: 2016-08-06 | Discharge: 2016-08-07 | Disposition: A | Discharge status: Home / Self Care | Payer: 59 | Source: Ambulatory Visit | Attending: Otolaryngology | Admitting: Otolaryngology

## 2016-08-06 DIAGNOSIS — D440 Neoplasm of uncertain behavior of thyroid gland: Secondary | ICD-10-CM | POA: Diagnosis not present

## 2016-08-06 DIAGNOSIS — R1310 Dysphagia, unspecified: Secondary | ICD-10-CM | POA: Diagnosis not present

## 2016-08-06 DIAGNOSIS — E049 Nontoxic goiter, unspecified: Secondary | ICD-10-CM | POA: Diagnosis not present

## 2016-08-06 DIAGNOSIS — Z880 Allergy status to penicillin: Secondary | ICD-10-CM | POA: Diagnosis not present

## 2016-08-06 DIAGNOSIS — F329 Major depressive disorder, single episode, unspecified: Secondary | ICD-10-CM | POA: Insufficient documentation

## 2016-08-06 DIAGNOSIS — D34 Benign neoplasm of thyroid gland: Secondary | ICD-10-CM | POA: Diagnosis present

## 2016-08-06 DIAGNOSIS — F17210 Nicotine dependence, cigarettes, uncomplicated: Secondary | ICD-10-CM | POA: Diagnosis not present

## 2016-08-06 HISTORY — PX: THYROIDECTOMY: SHX17

## 2016-08-06 SURGERY — THYROIDECTOMY
Anesthesia: General | Site: Neck | Laterality: Right

## 2016-08-06 MED ORDER — PROPOFOL 10 MG/ML IV BOLUS
INTRAVENOUS | Status: AC
  Filled 2016-08-06: qty 40

## 2016-08-06 MED ORDER — ONDANSETRON HCL 4 MG PO TABS: 4.0000 mg | ORAL_TABLET | ORAL | Status: DC | PRN

## 2016-08-06 MED ORDER — BACITRACIN ZINC 500 UNIT/GM EX OINT
TOPICAL_OINTMENT | CUTANEOUS | Status: DC | PRN
Start: 2016-08-06 — End: 2016-08-06
  Administered 2016-08-06: 1 via TOPICAL

## 2016-08-06 MED ORDER — BACITRACIN ZINC 500 UNIT/GM EX OINT
1.0000 "application " | TOPICAL_OINTMENT | Freq: Three times a day (TID) | CUTANEOUS | Status: DC
  Administered 2016-08-06 – 2016-08-07 (×3): 1 via TOPICAL
  Filled 2016-08-06: qty 28.35

## 2016-08-06 MED ORDER — 0.9 % SODIUM CHLORIDE (POUR BTL) OPTIME
TOPICAL | Status: DC | PRN
  Administered 2016-08-06: 1000 mL

## 2016-08-06 MED ORDER — ACETAMINOPHEN 160 MG/5ML PO SOLN: 650.0000 mg | ORAL | Status: DC | PRN

## 2016-08-06 MED ORDER — HYDROMORPHONE HCL 1 MG/ML IJ SOLN
0.2500 mg | INTRAMUSCULAR | Status: DC | PRN
  Administered 2016-08-06 (×3): 0.5 mg via INTRAVENOUS

## 2016-08-06 MED ORDER — VANCOMYCIN HCL IN DEXTROSE 1-5 GM/200ML-% IV SOLN
INTRAVENOUS | Status: AC
  Filled 2016-08-06: qty 200

## 2016-08-06 MED ORDER — LIDOCAINE HCL (CARDIAC) 20 MG/ML IV SOLN
INTRAVENOUS | Status: DC | PRN
  Administered 2016-08-06: 60 mg via INTRAVENOUS

## 2016-08-06 MED ORDER — MIDAZOLAM HCL 2 MG/2ML IJ SOLN
INTRAMUSCULAR | Status: AC
  Filled 2016-08-06: qty 2

## 2016-08-06 MED ORDER — DEXAMETHASONE SODIUM PHOSPHATE 10 MG/ML IJ SOLN
INTRAMUSCULAR | Status: DC | PRN
  Administered 2016-08-06: 5 mg via INTRAVENOUS

## 2016-08-06 MED ORDER — LACTATED RINGERS IV SOLN
INTRAVENOUS | Status: DC | PRN
  Administered 2016-08-06: 07:00:00 via INTRAVENOUS

## 2016-08-06 MED ORDER — FENTANYL CITRATE (PF) 100 MCG/2ML IJ SOLN
INTRAMUSCULAR | Status: AC
  Filled 2016-08-06: qty 4

## 2016-08-06 MED ORDER — PROPOFOL 10 MG/ML IV BOLUS
INTRAVENOUS | Status: DC | PRN
Start: 2016-08-06 — End: 2016-08-06
  Administered 2016-08-06: 150 mg via INTRAVENOUS

## 2016-08-06 MED ORDER — LIDOCAINE-EPINEPHRINE (PF) 1 %-1:200000 IJ SOLN
INTRAMUSCULAR | Status: AC
  Filled 2016-08-06: qty 30

## 2016-08-06 MED ORDER — CEFAZOLIN IN D5W 1 GM/50ML IV SOLN
1.0000 g | Freq: Three times a day (TID) | INTRAVENOUS | Status: DC
  Administered 2016-08-06 – 2016-08-07 (×3): 1 g via INTRAVENOUS
  Filled 2016-08-06 (×4): qty 50

## 2016-08-06 MED ORDER — LURASIDONE HCL 20 MG PO TABS
20.0000 mg | ORAL_TABLET | Freq: Every day | ORAL | Status: DC
  Administered 2016-08-06: 20 mg via ORAL
  Filled 2016-08-06: qty 1

## 2016-08-06 MED ORDER — MIDAZOLAM HCL 5 MG/5ML IJ SOLN
INTRAMUSCULAR | Status: DC | PRN
  Administered 2016-08-06 (×2): 2 mg via INTRAVENOUS

## 2016-08-06 MED ORDER — FENTANYL CITRATE (PF) 100 MCG/2ML IJ SOLN
INTRAMUSCULAR | Status: DC | PRN
  Administered 2016-08-06 (×4): 50 ug via INTRAVENOUS

## 2016-08-06 MED ORDER — ACETAMINOPHEN 650 MG RE SUPP: 650.0000 mg | RECTAL | Status: DC | PRN

## 2016-08-06 MED ORDER — HEMOSTATIC AGENTS (NO CHARGE) OPTIME
TOPICAL | Status: DC | PRN
  Administered 2016-08-06: 1 via TOPICAL

## 2016-08-06 MED ORDER — GLYCOPYRROLATE 0.2 MG/ML IJ SOLN
INTRAMUSCULAR | Status: DC | PRN
  Administered 2016-08-06: 0.6 mg via INTRAVENOUS

## 2016-08-06 MED ORDER — CEFAZOLIN SODIUM 1 G IJ SOLR
INTRAMUSCULAR | Status: DC | PRN
  Administered 2016-08-06: 2 g

## 2016-08-06 MED ORDER — IBUPROFEN 400 MG PO TABS: 400.0000 mg | ORAL_TABLET | Freq: Three times a day (TID) | ORAL | Status: DC | PRN

## 2016-08-06 MED ORDER — DIPHENHYDRAMINE HCL 50 MG/ML IJ SOLN
INTRAMUSCULAR | Status: DC | PRN
  Administered 2016-08-06: 25 mg via INTRAVENOUS

## 2016-08-06 MED ORDER — ONDANSETRON HCL 4 MG/2ML IJ SOLN
4.0000 mg | INTRAMUSCULAR | Status: DC | PRN
Start: 2016-08-06 — End: 2016-08-07

## 2016-08-06 MED ORDER — HYDROMORPHONE HCL 1 MG/ML IJ SOLN
INTRAMUSCULAR | Status: AC
  Filled 2016-08-06: qty 1

## 2016-08-06 MED ORDER — MEPERIDINE HCL 25 MG/ML IJ SOLN: 6.2500 mg | INTRAMUSCULAR | Status: DC | PRN

## 2016-08-06 MED ORDER — HYDROCODONE-ACETAMINOPHEN 5-325 MG PO TABS
1.0000 | ORAL_TABLET | ORAL | Status: DC | PRN
  Administered 2016-08-06 (×2): 2 via ORAL
  Filled 2016-08-06 (×2): qty 2

## 2016-08-06 MED ORDER — MUPIROCIN 2 % EX OINT: 1.0000 "application " | TOPICAL_OINTMENT | Freq: Three times a day (TID) | CUTANEOUS | Status: DC

## 2016-08-06 MED ORDER — HYDROMORPHONE HCL 1 MG/ML IJ SOLN
INTRAMUSCULAR | Status: AC
  Filled 2016-08-06: qty 0.5

## 2016-08-06 MED ORDER — ROCURONIUM BROMIDE 100 MG/10ML IV SOLN
INTRAVENOUS | Status: DC | PRN
  Administered 2016-08-06: 40 mg via INTRAVENOUS

## 2016-08-06 MED ORDER — NEOSTIGMINE METHYLSULFATE 10 MG/10ML IV SOLN
INTRAVENOUS | Status: DC | PRN
  Administered 2016-08-06: 3 mg via INTRAVENOUS

## 2016-08-06 MED ORDER — LIDOCAINE-EPINEPHRINE (PF) 1 %-1:200000 IJ SOLN
INTRAMUSCULAR | Status: DC | PRN
  Administered 2016-08-06: 4 mL

## 2016-08-06 MED ORDER — MORPHINE SULFATE (PF) 2 MG/ML IV SOLN
2.0000 mg | INTRAVENOUS | Status: DC | PRN
  Administered 2016-08-06: 4 mg via INTRAVENOUS
  Filled 2016-08-06: qty 2

## 2016-08-06 MED ORDER — ONDANSETRON HCL 4 MG/2ML IJ SOLN
INTRAMUSCULAR | Status: DC | PRN
  Administered 2016-08-06: 4 mg via INTRAVENOUS

## 2016-08-06 MED ORDER — BACITRACIN ZINC 500 UNIT/GM EX OINT
TOPICAL_OINTMENT | CUTANEOUS | Status: AC
  Filled 2016-08-06: qty 28.35

## 2016-08-06 MED ORDER — ONDANSETRON HCL 4 MG/2ML IJ SOLN: 4.0000 mg | Freq: Once | INTRAMUSCULAR | Status: DC | PRN

## 2016-08-06 SURGICAL SUPPLY — 57 items
ATTRACTOMAT 16X20 MAGNETIC DRP (DRAPES) IMPLANT
BLADE SURG 10 STRL SS (BLADE) ×3 IMPLANT
BLADE SURG 15 STRL LF DISP TIS (BLADE) IMPLANT
BLADE SURG 15 STRL SS (BLADE)
BLADE SURG ROTATE 9660 (MISCELLANEOUS) IMPLANT
CANISTER SUCTION 2500CC (MISCELLANEOUS) ×3 IMPLANT
CLEANER TIP ELECTROSURG 2X2 (MISCELLANEOUS) ×3 IMPLANT
CLOSURE WOUND 1/2 X4 (GAUZE/BANDAGES/DRESSINGS)
CONT SPEC 4OZ CLIKSEAL STRL BL (MISCELLANEOUS) IMPLANT
CORDS BIPOLAR (ELECTRODE) ×3 IMPLANT
COVER SURGICAL LIGHT HANDLE (MISCELLANEOUS) ×3 IMPLANT
DRAIN SNY 7 FPER (WOUND CARE) IMPLANT
DRAPE PROXIMA HALF (DRAPES) IMPLANT
ELECT COATED BLADE 2.86 ST (ELECTRODE) ×3 IMPLANT
ELECT REM PT RETURN 9FT ADLT (ELECTROSURGICAL) ×3
ELECTRODE REM PT RTRN 9FT ADLT (ELECTROSURGICAL) ×1 IMPLANT
EVACUATOR SILICONE 100CC (DRAIN) IMPLANT
GAUZE SPONGE 4X4 12PLY STRL (GAUZE/BANDAGES/DRESSINGS) ×3 IMPLANT
GAUZE SPONGE 4X4 16PLY XRAY LF (GAUZE/BANDAGES/DRESSINGS) ×3 IMPLANT
GLOVE BIO SURGEON STRL SZ7 (GLOVE) ×3 IMPLANT
GLOVE BIO SURGEON STRL SZ8 (GLOVE) ×6 IMPLANT
GLOVE BIOGEL PI IND STRL 7.5 (GLOVE) ×1 IMPLANT
GLOVE BIOGEL PI IND STRL 8.5 (GLOVE) ×2 IMPLANT
GLOVE BIOGEL PI INDICATOR 7.5 (GLOVE) ×2
GLOVE BIOGEL PI INDICATOR 8.5 (GLOVE) ×4
GLOVE SS BIOGEL STRL SZ 7.5 (GLOVE) ×1 IMPLANT
GLOVE SUPERSENSE BIOGEL SZ 7.5 (GLOVE) ×2
GLOVE SURG SS PI 7.5 STRL IVOR (GLOVE) ×3 IMPLANT
GOWN STRL REUS W/ TWL LRG LVL3 (GOWN DISPOSABLE) ×1 IMPLANT
GOWN STRL REUS W/ TWL XL LVL3 (GOWN DISPOSABLE) ×4 IMPLANT
GOWN STRL REUS W/TWL LRG LVL3 (GOWN DISPOSABLE) ×2
GOWN STRL REUS W/TWL XL LVL3 (GOWN DISPOSABLE) ×8
HEMOSTAT SURGICEL 2X4 FIBR (HEMOSTASIS) ×3 IMPLANT
KIT BASIN OR (CUSTOM PROCEDURE TRAY) ×3 IMPLANT
KIT ROOM TURNOVER OR (KITS) ×3 IMPLANT
LOCATOR NERVE 3 VOLT (DISPOSABLE) IMPLANT
NEEDLE HYPO 25GX1X1/2 BEV (NEEDLE) IMPLANT
NS IRRIG 1000ML POUR BTL (IV SOLUTION) ×3 IMPLANT
PAD ARMBOARD 7.5X6 YLW CONV (MISCELLANEOUS) ×6 IMPLANT
PENCIL BUTTON HOLSTER BLD 10FT (ELECTRODE) ×3 IMPLANT
SPECIMEN JAR MEDIUM (MISCELLANEOUS) IMPLANT
SPONGE INTESTINAL PEANUT (DISPOSABLE) ×3 IMPLANT
STAPLER VISISTAT 35W (STAPLE) ×3 IMPLANT
STRIP CLOSURE SKIN 1/2X4 (GAUZE/BANDAGES/DRESSINGS) IMPLANT
SUT CHROMIC 3 0 PS 2 (SUTURE) ×3 IMPLANT
SUT ETHILON 2 0 FS 18 (SUTURE) IMPLANT
SUT ETHILON 5 0 P 3 18 (SUTURE)
SUT ETHILON 6 0 P 1 (SUTURE) ×3 IMPLANT
SUT NYLON ETHILON 5-0 P-3 1X18 (SUTURE) IMPLANT
SUT SILK 2 0 (SUTURE) ×2
SUT SILK 2 0 SH CR/8 (SUTURE) IMPLANT
SUT SILK 2-0 18XBRD TIE 12 (SUTURE) ×1 IMPLANT
SUT SILK 3 0 (SUTURE) ×2
SUT SILK 3-0 18XBRD TIE 12 (SUTURE) ×1 IMPLANT
SUT SILK 4 0 (SUTURE)
SUT SILK 4-0 18XBRD TIE 12 (SUTURE) IMPLANT
TRAY ENT MC OR (CUSTOM PROCEDURE TRAY) ×3 IMPLANT

## 2016-08-06 NOTE — Progress Notes (Signed)
Post Op Check Doing well No Swelling or resp problems Voice good Stable post op course Plan to discharge in am

## 2016-08-06 NOTE — Interval H&P Note (Signed)
History and Physical Interval Note:  08/06/2016 7:28 AM  Tina Solis  has presented today for surgery, with the diagnosis of DYSPHAGIA  The various methods of treatment have been discussed with the patient and family. After consideration of risks, benefits and other options for treatment, the patient has consented to  Procedure(s): RIGHT THYROIDECTOMY (Right) as a surgical intervention .  The patient's history has been reviewed, patient examined, no change in status, stable for surgery.  I have reviewed the patient's chart and labs.  Questions were answered to the patient's satisfaction.     Daliana Leverett

## 2016-08-06 NOTE — Anesthesia Procedure Notes (Signed)
Procedure Name: Intubation Date/Time: 08/06/2016 7:42 AM Performed by: Valda Favia Pre-anesthesia Checklist: Patient identified, Emergency Drugs available, Suction available, Patient being monitored and Timeout performed Patient Re-evaluated:Patient Re-evaluated prior to inductionOxygen Delivery Method: Circle system utilized Preoxygenation: Pre-oxygenation with 100% oxygen Intubation Type: IV induction Ventilation: Mask ventilation without difficulty Laryngoscope Size: Mac and 3 Grade View: Grade I Tube type: Oral Tube size: 7.0 mm Number of attempts: 1 Airway Equipment and Method: Stylet Placement Confirmation: ETT inserted through vocal cords under direct vision,  positive ETCO2 and breath sounds checked- equal and bilateral Secured at: 20 cm Tube secured with: Tape Dental Injury: Teeth and Oropharynx as per pre-operative assessment

## 2016-08-06 NOTE — Transfer of Care (Signed)
Immediate Anesthesia Transfer of Care Note  Patient: Tina Solis  Procedure(s) Performed: Procedure(s): RIGHT THYROIDECTOMY (Right)  Patient Location: PACU  Anesthesia Type:General  Level of Consciousness: awake, alert  and oriented  Airway & Oxygen Therapy: Patient Spontanous Breathing and Patient connected to nasal cannula oxygen  Post-op Assessment: Report given to RN and Post -op Vital signs reviewed and stable  Post vital signs: Reviewed and stable  Last Vitals:  Vitals:   08/06/16 0613 08/06/16 0926  BP: 122/78 (!) 134/94  Pulse: 82 81  Resp: 18 12  Temp: 36.9 C 36.6 C    Last Pain:  Vitals:   08/06/16 0613  TempSrc: Oral         Complications: No apparent anesthesia complications

## 2016-08-06 NOTE — Interval H&P Note (Signed)
History and Physical Interval Note:  08/06/2016 7:27 AM  Tina Solis  has presented today for surgery, with the diagnosis of DYSPHAGIA  The various methods of treatment have been discussed with the patient and family. After consideration of risks, benefits and other options for treatment, the patient has consented to  Procedure(s): RIGHT THYROIDECTOMY (Right) as a surgical intervention .  The patient's history has been reviewed, patient examined, no change in status, stable for surgery.  I have reviewed the patient's chart and labs.  Questions were answered to the patient's satisfaction.     [name]

## 2016-08-06 NOTE — Anesthesia Preprocedure Evaluation (Signed)
Anesthesia Evaluation  Patient identified by MRN, date of birth, ID band Patient awake    Reviewed: Allergy & Precautions, NPO status , Patient's Chart, lab work & pertinent test results  Airway Mallampati: I  TM Distance: >3 FB Neck ROM: Full    Dental   Pulmonary Current Smoker,    Pulmonary exam normal        Cardiovascular Normal cardiovascular exam     Neuro/Psych Depression    GI/Hepatic   Endo/Other    Renal/GU      Musculoskeletal   Abdominal   Peds  Hematology   Anesthesia Other Findings   Reproductive/Obstetrics                             Anesthesia Physical Anesthesia Plan  ASA: II  Anesthesia Plan: General   Post-op Pain Management:    Induction: Intravenous  Airway Management Planned: Oral ETT  Additional Equipment:   Intra-op Plan:   Post-operative Plan: Extubation in OR  Informed Consent: I have reviewed the patients History and Physical, chart, labs and discussed the procedure including the risks, benefits and alternatives for the proposed anesthesia with the patient or authorized representative who has indicated his/her understanding and acceptance.     Plan Discussed with: CRNA and Surgeon  Anesthesia Plan Comments:         Anesthesia Quick Evaluation

## 2016-08-06 NOTE — Brief Op Note (Signed)
08/06/2016  9:22 AM  PATIENT:  Tina Solis  36 y.o. female  PRE-OPERATIVE DIAGNOSIS:  DYSPHAGIA  RIGHT THYROID MASS  POST-OPERATIVE DIAGNOSIS:  DYSPHAGIA  RIGHT THYROID MASS  PROCEDURE:  Procedure(s): RIGHT THYROIDECTOMY (Right)  SURGEON:  Surgeon(s) and Role:    * Rozetta Nunnery, MD - Primary    * Thornell Sartorius, MD - Assisting  PHYSICIAN ASSISTANT:   ASSISTANTS: Minna Merritts   ANESTHESIA:   general  EBL:  No intake/output data recorded.  BLOOD ADMINISTERED:none  DRAINS: none   LOCAL MEDICATIONS USED:  XYLOCAINE with EPI  4 cc  SPECIMEN:  Source of Specimen:  right thyroid lobe mass  DISPOSITION OF SPECIMEN:  Source of Specimen:  right thyroid mass lobe  COUNTS:  YES  TOURNIQUET:  * No tourniquets in log *  DICTATION: .Other Dictation: Dictation Number [id number]  PLAN OF CARE: Admit for overnight observation  PATIENT DISPOSITION:  PACU - hemodynamically stable.   Delay start of Pharmacological VTE agent (>24hrs) due to surgical blood loss or risk of bleeding: yes

## 2016-08-06 NOTE — Anesthesia Postprocedure Evaluation (Signed)
Anesthesia Post Note  Patient: Tina Solis  Procedure(s) Performed: Procedure(s) (LRB): RIGHT THYROIDECTOMY (Right)  Patient location during evaluation: PACU Anesthesia Type: General Level of consciousness: awake and alert Pain management: pain level controlled Vital Signs Assessment: post-procedure vital signs reviewed and stable Respiratory status: spontaneous breathing, nonlabored ventilation, respiratory function stable and patient connected to nasal cannula oxygen Cardiovascular status: blood pressure returned to baseline and stable Postop Assessment: no signs of nausea or vomiting Anesthetic complications: no       Last Vitals:  Vitals:   08/06/16 1040 08/06/16 1056  BP: 125/90 116/78  Pulse: 91 76  Resp: 13 14  Temp: 36.6 C 37.1 C    Last Pain:  Vitals:   08/06/16 1056  TempSrc: Oral  PainSc: 6                  [name] DAVID

## 2016-08-06 NOTE — Progress Notes (Signed)
  Pt admitted to the unit. Pt is stable, alert and oriented per baseline. Oriented to room, staff, and call bell. Educated to call for any assistance. Bed in lowest position, call bell within reach- will continue to monitor. 

## 2016-08-07 ENCOUNTER — Encounter (HOSPITAL_COMMUNITY): Payer: Self-pay | Admitting: Otolaryngology

## 2016-08-07 DIAGNOSIS — D34 Benign neoplasm of thyroid gland: Secondary | ICD-10-CM | POA: Diagnosis not present

## 2016-08-07 MED ORDER — HYDROCODONE-ACETAMINOPHEN 5-325 MG PO TABS: 1.0000 | ORAL_TABLET | Freq: Four times a day (QID) | ORAL | 0 refills | Status: DC | PRN

## 2016-08-07 NOTE — Progress Notes (Signed)
POD 1 AF VSS Wound doing well without swelling or eythema Voice and airway normal Discharge home  Vicodin 1 q 6 hrs prn pain Follow up next Thursday at 4

## 2016-08-07 NOTE — Discharge Instructions (Signed)
Apply antibiotic ointment to incision site daily OK to get incision site wet tomorrow Tylenol , motrin or hydrocodone prn pain Return to see Dr Lucia Gaskins at his office next Thursday at 4 pm

## 2016-08-07 NOTE — Discharge Summary (Signed)
Dictated  [id number]

## 2016-08-09 NOTE — Discharge Summary (Signed)
Tina Solis, FLIER NO.:  [id number]  MEDICAL RECORD NO.:  [id number]  LOCATION:                                 FACILITY:  PHYSICIAN:  Leonides Sake. Lucia Gaskins, M.D.DATE OF BIRTH:  1981-04-30  DATE OF ADMISSION:  08/06/2016 DATE OF DISCHARGE:  08/07/2016                              DISCHARGE SUMMARY   DIAGNOSIS:  Right thyroid mass with dysphagia.  OTHER DIAGNOSES:  History of depression.  OPERATION DURING THIS HOSPITAL COURSE:  Right thyroid lobectomy on August 06, 2016.  HOSPITAL COURSE:  The patient was admitted via the operating room on August 06, 2016 at which time she underwent a right thyroid lobectomy because of enlarging right thyroid mass.  She did well, tolerated the surgery well, and was subsequently admitted for overnight observation. She received perioperative Ancef.  She did well postoperatively with no airway problems and no voice changes or hoarseness.  On the 1st postoperative day, the wound was doing well with no swelling, no erythema, no breathing or voice problems.  She was subsequently discharged home on postop day #1.  DISCHARGE MEDICATIONS:  The patient is discharged home on regular medications including hydrocodone 5/325 mg tablets 1-2 q.6 hours p.r.n. pain.  We will have her follow up in office in 5 days for recheck and have sutures removed and review pathology.    ______________________________ Leonides Sake Lucia Gaskins, M.D.   ______________________________ Leonides Sake. Lucia Gaskins, M.D.    CEN/MEDQ  D:  08/07/2016  T:  08/08/2016  Job:  [id number]

## 2016-08-09 NOTE — Op Note (Signed)
**Note Tina Solis** NAMEADORE, THURGOOD NO.:  [id number]  MEDICAL RECORD NO.:  [id number]  LOCATION:                                 FACILITY:  PHYSICIAN:  Leonides Sake. Lucia Gaskins, M.D.DATE OF BIRTH:  Jul 08, 1980  DATE OF PROCEDURE:  08/06/2016 DATE OF DISCHARGE:                              OPERATIVE REPORT   PREOPERATIVE DIAGNOSIS:  Right thyroid mass with dysphagia.  POSTOPERATIVE DIAGNOSIS:  Right thyroid mass with dysphagia.  OPERATION PERFORMED:  Right thyroid lobectomy with removal of right thyroid mass.  SURGEON:  Leonides Sake. Lucia Gaskins, M.D.  ASSISTANT SURGEON:  Minna Merritts, M.D.  ANESTHESIA:  General endotracheal.  COMPLICATIONS:  None.  BLOOD LOSS:  Minimal.  BRIEF HISTORY:  Tina Solis is a 36 year old female, who has had a slowly enlarging right thyroid mass.  On a recent ultrasound, it measured 5.5 x 5 cm in size.  It was partially solid, partially cystic. Fine needle aspirate was benign showing Bethesda level 1 follicular neoplasm.  However, this has caused some dysphagia problems for her.  It is gradually enlarging.  She would like to have it removed.  DESCRIPTION OF PROCEDURE:  After adequate endotracheal anesthesia, the patient's neck was extended.  A roll was placed underneath the shoulders and the proposed incision site was marked out and injected with 4 mL of Xylocaine with epinephrine for hemostasis and local anesthetic.  Neck was then sterilely prepped with Betadine solution.  An incision was made down through the skin and subcutaneous tissue and platysma muscle. Flaps were elevated superiorly over the thyroid mass and inferiorly. The strap muscles were divided in midline and retracted laterally over the right thyroid mass.  The mass extended up to approximately the isthmus.  The isthmus was divided in the midline.  The mass occupied probably majority or 80% of the right thyroid lobe.  Some fluid extruded from the mass as it was partially  cystic.  Strap muscles were dissected off the right thyroid lobe.  The thyroid veins laterally were clamped with 2-0 and 3-0 silk sutures.  The inferior thyroid vein and artery were identified and then ligated with 2-0 silk sutures as were the superior thyroid vein and artery likewise ligated.  Thyroid lobe was dissected off the trachea down to the tracheoesophageal groove where the recurrent nerve was identified.  A small portion of the thyroid lobe down in the tracheoesophageal groove was left around the area of Berry ligament.  Hemostasis was obtained with the bipolar cautery and 3-0 silk ligatures.  After obtaining adequate hemostasis, the wound was irrigated with saline.  Some snow was placed in the thyroid bed for further hemostasis and the specimen was sent to Pathology and defect was closed with a couple of 3-0 chromic sutures reapproximating strap muscles, 3-0 chromic sutures subcutaneously, and 6-0 nylon to reapproximate skin edges.  Bactroban 2% ointment and dressing was applied.  The patient was awoken from anesthesia and transferred to recovery room, postop doing well.  DISPOSITION:  The patient will be observed overnight for any airway problems of bleeding.  We will plan on discharge home in the morning on Tylenol, Motrin, and hydrocodone p.r.n. pain.  However, follow up in my office  in 6 days for recheck and removal of sutures and to review pathology.    ______________________________ Leonides Sake Lucia Gaskins, M.D.   ______________________________ Leonides Sake. Lucia Gaskins, M.D.    CEN/MEDQ  D:  08/06/2016  T:  08/07/2016  Job:  [id number]

## 2016-08-10 DIAGNOSIS — E890 Postprocedural hypothyroidism: Secondary | ICD-10-CM | POA: Diagnosis not present

## 2016-09-02 ENCOUNTER — Ambulatory Visit: Payer: 59 | Admitting: Gynecology

## 2016-09-02 DIAGNOSIS — Z0289 Encounter for other administrative examinations: Secondary | ICD-10-CM

## 2016-09-16 DIAGNOSIS — Z6821 Body mass index (BMI) 21.0-21.9, adult: Secondary | ICD-10-CM | POA: Diagnosis not present

## 2016-10-01 DIAGNOSIS — Z6821 Body mass index (BMI) 21.0-21.9, adult: Secondary | ICD-10-CM | POA: Diagnosis not present
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
## 2016-10-06 ENCOUNTER — Encounter (INDEPENDENT_AMBULATORY_CARE_PROVIDER_SITE_OTHER): Payer: Self-pay

## 2016-10-06 ENCOUNTER — Ambulatory Visit (INDEPENDENT_AMBULATORY_CARE_PROVIDER_SITE_OTHER): Payer: 59 | Admitting: Psychiatry

## 2016-10-06 ENCOUNTER — Encounter (HOSPITAL_COMMUNITY): Payer: Self-pay | Admitting: Psychiatry

## 2016-10-06 VITALS — BP 113/82 | HR 68 | Ht 62.0 in | Wt 123.6 lb

## 2016-10-06 DIAGNOSIS — Z818 Family history of other mental and behavioral disorders: Secondary | ICD-10-CM

## 2016-10-06 DIAGNOSIS — Z79899 Other long term (current) drug therapy: Secondary | ICD-10-CM

## 2016-10-06 DIAGNOSIS — F17210 Nicotine dependence, cigarettes, uncomplicated: Secondary | ICD-10-CM

## 2016-10-06 DIAGNOSIS — F1290 Cannabis use, unspecified, uncomplicated: Secondary | ICD-10-CM

## 2016-10-06 DIAGNOSIS — F331 Major depressive disorder, recurrent, moderate: Secondary | ICD-10-CM | POA: Diagnosis not present

## 2016-10-06 DIAGNOSIS — R45851 Suicidal ideations: Secondary | ICD-10-CM

## 2016-10-06 DIAGNOSIS — F4310 Post-traumatic stress disorder, unspecified: Secondary | ICD-10-CM

## 2016-10-06 DIAGNOSIS — F411 Generalized anxiety disorder: Secondary | ICD-10-CM | POA: Insufficient documentation

## 2016-10-06 MED ORDER — SERTRALINE HCL 50 MG PO TABS: ORAL_TABLET | ORAL | 1 refills | Status: DC

## 2016-10-06 MED ORDER — HYDROXYZINE HCL 25 MG PO TABS: 25.0000 mg | ORAL_TABLET | Freq: Three times a day (TID) | ORAL | 1 refills | Status: DC | PRN

## 2016-10-06 NOTE — Patient Instructions (Addendum)
1. Start sertraline 25 mg daily for two weeks, then 50 mg daily 2. Discontinue Xanax 3. Start hydroxyzine 25 mg three times a day as needed for anxiety 4. Return to clinic in one month for 30 mins 5. Contact for therapy: Dr. Alford Highland Schneidmiller  [phone] [address] [place], [place] [address]

## 2016-11-02 DIAGNOSIS — J020 Streptococcal pharyngitis: Secondary | ICD-10-CM | POA: Diagnosis not present

## 2016-11-02 DIAGNOSIS — Z1389 Encounter for screening for other disorder: Secondary | ICD-10-CM | POA: Diagnosis not present

## 2016-11-02 DIAGNOSIS — Z6821 Body mass index (BMI) 21.0-21.9, adult: Secondary | ICD-10-CM | POA: Diagnosis not present
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
## 2016-11-04 ENCOUNTER — Ambulatory Visit (INDEPENDENT_AMBULATORY_CARE_PROVIDER_SITE_OTHER): Payer: 59 | Admitting: Psychiatry

## 2016-11-04 ENCOUNTER — Encounter (HOSPITAL_COMMUNITY): Payer: Self-pay | Admitting: Psychiatry

## 2016-11-04 VITALS — BP 132/92 | HR 69 | Ht 62.0 in | Wt 121.0 lb

## 2016-11-04 DIAGNOSIS — F1290 Cannabis use, unspecified, uncomplicated: Secondary | ICD-10-CM

## 2016-11-04 DIAGNOSIS — F17210 Nicotine dependence, cigarettes, uncomplicated: Secondary | ICD-10-CM

## 2016-11-04 DIAGNOSIS — Z818 Family history of other mental and behavioral disorders: Secondary | ICD-10-CM | POA: Diagnosis not present

## 2016-11-04 DIAGNOSIS — Z9889 Other specified postprocedural states: Secondary | ICD-10-CM

## 2016-11-04 DIAGNOSIS — Z813 Family history of other psychoactive substance abuse and dependence: Secondary | ICD-10-CM

## 2016-11-04 DIAGNOSIS — R45851 Suicidal ideations: Secondary | ICD-10-CM

## 2016-11-04 DIAGNOSIS — F331 Major depressive disorder, recurrent, moderate: Secondary | ICD-10-CM

## 2016-11-04 DIAGNOSIS — F4310 Post-traumatic stress disorder, unspecified: Secondary | ICD-10-CM | POA: Diagnosis not present

## 2016-11-04 DIAGNOSIS — Z79899 Other long term (current) drug therapy: Secondary | ICD-10-CM

## 2016-11-04 DIAGNOSIS — F1011 Alcohol abuse, in remission: Secondary | ICD-10-CM

## 2016-11-04 MED ORDER — HYDROXYZINE HCL 25 MG PO TABS: 25.0000 mg | ORAL_TABLET | Freq: Three times a day (TID) | ORAL | 0 refills | Status: DC | PRN

## 2016-11-04 MED ORDER — SERTRALINE HCL 100 MG PO TABS: 100.0000 mg | ORAL_TABLET | Freq: Every day | ORAL | 1 refills | Status: DC

## 2016-11-04 NOTE — Patient Instructions (Addendum)
1. Continue sertraline 100 mg daily 2. Start hydroxyzine 25 mg three times a day as needed for anxiety 3. Continue Latuda 80 mg daily 4. Return to clinic in one month for 30 mins 5. Contact for therapy: Dr. Alford Highland Schneidmiller  [phone] [address], [place], [place] [address]
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
## 2016-12-02 ENCOUNTER — Ambulatory Visit (INDEPENDENT_AMBULATORY_CARE_PROVIDER_SITE_OTHER): Payer: 59 | Admitting: Psychiatry

## 2016-12-02 VITALS — BP 114/82 | HR 100 | Ht 62.0 in | Wt 124.8 lb

## 2016-12-02 DIAGNOSIS — F331 Major depressive disorder, recurrent, moderate: Secondary | ICD-10-CM | POA: Diagnosis not present

## 2016-12-02 DIAGNOSIS — Z818 Family history of other mental and behavioral disorders: Secondary | ICD-10-CM | POA: Diagnosis not present

## 2016-12-02 DIAGNOSIS — F17210 Nicotine dependence, cigarettes, uncomplicated: Secondary | ICD-10-CM | POA: Diagnosis not present

## 2016-12-02 DIAGNOSIS — Z9889 Other specified postprocedural states: Secondary | ICD-10-CM

## 2016-12-02 DIAGNOSIS — F1011 Alcohol abuse, in remission: Secondary | ICD-10-CM | POA: Diagnosis not present

## 2016-12-02 DIAGNOSIS — F4310 Post-traumatic stress disorder, unspecified: Secondary | ICD-10-CM

## 2016-12-02 DIAGNOSIS — Z813 Family history of other psychoactive substance abuse and dependence: Secondary | ICD-10-CM | POA: Diagnosis not present

## 2016-12-02 DIAGNOSIS — F1290 Cannabis use, unspecified, uncomplicated: Secondary | ICD-10-CM

## 2016-12-02 MED ORDER — LORAZEPAM 0.5 MG PO TABS: 0.5000 mg | ORAL_TABLET | Freq: Every day | ORAL | 0 refills | Status: DC | PRN

## 2016-12-02 MED ORDER — SERTRALINE HCL 100 MG PO TABS: 150.0000 mg | ORAL_TABLET | Freq: Every day | ORAL | 1 refills | Status: DC

## 2016-12-02 MED ORDER — LURASIDONE HCL 80 MG PO TABS: 80.0000 mg | ORAL_TABLET | Freq: Every day | ORAL | 1 refills | Status: DC

## 2016-12-02 NOTE — Patient Instructions (Signed)
1. Increase sertraline 150 mg daily 2. Discontinue hydroxyzine 3. Start lorazepam 0.5 mg daily as needed for anxiety 4. Continue Latuda 80 mg daily 5. Return to clinic in one month for 30 mins

## 2016-12-16 DIAGNOSIS — R05 Cough: Secondary | ICD-10-CM | POA: Diagnosis not present

## 2016-12-16 DIAGNOSIS — Z6821 Body mass index (BMI) 21.0-21.9, adult: Secondary | ICD-10-CM | POA: Diagnosis not present
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
## 2016-12-30 ENCOUNTER — Ambulatory Visit (HOSPITAL_COMMUNITY): Payer: Self-pay | Admitting: Psychiatry

## 2017-01-03 ENCOUNTER — Ambulatory Visit (INDEPENDENT_AMBULATORY_CARE_PROVIDER_SITE_OTHER): Payer: 59 | Admitting: Psychiatry

## 2017-01-03 VITALS — BP 127/89 | HR 83 | Wt 122.0 lb

## 2017-01-03 DIAGNOSIS — F1290 Cannabis use, unspecified, uncomplicated: Secondary | ICD-10-CM | POA: Diagnosis not present

## 2017-01-03 DIAGNOSIS — F331 Major depressive disorder, recurrent, moderate: Secondary | ICD-10-CM | POA: Diagnosis not present

## 2017-01-03 DIAGNOSIS — F1011 Alcohol abuse, in remission: Secondary | ICD-10-CM | POA: Diagnosis not present

## 2017-01-03 DIAGNOSIS — Z818 Family history of other mental and behavioral disorders: Secondary | ICD-10-CM

## 2017-01-03 DIAGNOSIS — F4310 Post-traumatic stress disorder, unspecified: Secondary | ICD-10-CM

## 2017-01-03 DIAGNOSIS — F17210 Nicotine dependence, cigarettes, uncomplicated: Secondary | ICD-10-CM

## 2017-01-03 MED ORDER — SERTRALINE HCL 100 MG PO TABS: 150.0000 mg | ORAL_TABLET | Freq: Every day | ORAL | 1 refills | Status: DC

## 2017-01-03 MED ORDER — LURASIDONE HCL 20 MG PO TABS: 20.0000 mg | ORAL_TABLET | Freq: Every day | ORAL | 1 refills | Status: DC

## 2017-01-03 MED ORDER — LURASIDONE HCL 80 MG PO TABS: 80.0000 mg | ORAL_TABLET | Freq: Every day | ORAL | 1 refills | Status: DC

## 2017-01-03 MED ORDER — LORAZEPAM 0.5 MG PO TABS: 0.5000 mg | ORAL_TABLET | Freq: Every day | ORAL | 0 refills | Status: DC | PRN
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
## 2017-01-03 NOTE — Patient Instructions (Addendum)
1. Continue sertraline 150 mg daily 2. Continue lorazepam 0.5 mg daily as needed for anxiety 3. Increase Latuda  100 mg (80 mg +20mg ) daily 4. Return to clinic I none month for 30 mins
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
## 2017-02-03 ENCOUNTER — Encounter (HOSPITAL_COMMUNITY): Payer: Self-pay | Admitting: Psychiatry

## 2017-02-03 ENCOUNTER — Ambulatory Visit (INDEPENDENT_AMBULATORY_CARE_PROVIDER_SITE_OTHER): Payer: 59 | Admitting: Psychiatry

## 2017-02-03 VITALS — BP 121/81 | HR 78 | Ht 62.0 in | Wt 125.6 lb

## 2017-02-03 DIAGNOSIS — F1290 Cannabis use, unspecified, uncomplicated: Secondary | ICD-10-CM

## 2017-02-03 DIAGNOSIS — F419 Anxiety disorder, unspecified: Secondary | ICD-10-CM

## 2017-02-03 DIAGNOSIS — F331 Major depressive disorder, recurrent, moderate: Secondary | ICD-10-CM | POA: Diagnosis not present

## 2017-02-03 DIAGNOSIS — F1011 Alcohol abuse, in remission: Secondary | ICD-10-CM | POA: Diagnosis not present

## 2017-02-03 DIAGNOSIS — Z630 Problems in relationship with spouse or partner: Secondary | ICD-10-CM

## 2017-02-03 DIAGNOSIS — Z9889 Other specified postprocedural states: Secondary | ICD-10-CM

## 2017-02-03 DIAGNOSIS — F4310 Post-traumatic stress disorder, unspecified: Secondary | ICD-10-CM | POA: Diagnosis not present

## 2017-02-03 DIAGNOSIS — F17210 Nicotine dependence, cigarettes, uncomplicated: Secondary | ICD-10-CM

## 2017-02-03 MED ORDER — SERTRALINE HCL 100 MG PO TABS: 150.0000 mg | ORAL_TABLET | Freq: Every day | ORAL | 1 refills | Status: DC

## 2017-02-03 MED ORDER — LURASIDONE HCL 80 MG PO TABS: 80.0000 mg | ORAL_TABLET | Freq: Every day | ORAL | 1 refills | Status: DC

## 2017-02-03 NOTE — Patient Instructions (Signed)
1. Increase sertraline 200 mg daily 2. Continue lorazepam 0.5 mg daily as needed  3. Continue Latuda 80 mg daily 4 .Return to clinic in one month for 30 mins
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
## 2017-03-07 ENCOUNTER — Ambulatory Visit (HOSPITAL_COMMUNITY): Payer: Self-pay | Admitting: Psychiatry
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
## 2017-03-29 ENCOUNTER — Ambulatory Visit (INDEPENDENT_AMBULATORY_CARE_PROVIDER_SITE_OTHER): Payer: 59 | Admitting: Psychiatry

## 2017-03-29 VITALS — BP 139/88 | HR 88 | Ht 62.01 in | Wt 129.0 lb

## 2017-03-29 DIAGNOSIS — F17210 Nicotine dependence, cigarettes, uncomplicated: Secondary | ICD-10-CM

## 2017-03-29 DIAGNOSIS — F4310 Post-traumatic stress disorder, unspecified: Secondary | ICD-10-CM | POA: Diagnosis not present

## 2017-03-29 DIAGNOSIS — G4700 Insomnia, unspecified: Secondary | ICD-10-CM

## 2017-03-29 DIAGNOSIS — Z564 Discord with boss and workmates: Secondary | ICD-10-CM | POA: Diagnosis not present

## 2017-03-29 DIAGNOSIS — Z79899 Other long term (current) drug therapy: Secondary | ICD-10-CM

## 2017-03-29 DIAGNOSIS — F331 Major depressive disorder, recurrent, moderate: Secondary | ICD-10-CM

## 2017-03-29 MED ORDER — LORAZEPAM 0.5 MG PO TABS
0.5000 mg | ORAL_TABLET | Freq: Every day | ORAL | 0 refills | Status: DC | PRN
Start: 2017-03-29 — End: 2021-11-27

## 2017-03-29 MED ORDER — SERTRALINE HCL 100 MG PO TABS: 200.0000 mg | ORAL_TABLET | Freq: Every day | ORAL | 1 refills | Status: DC

## 2017-03-29 MED ORDER — LURASIDONE HCL 80 MG PO TABS: 80.0000 mg | ORAL_TABLET | Freq: Every day | ORAL | 1 refills | Status: DC

## 2017-03-29 NOTE — Patient Instructions (Signed)
1. Increase sertraline 200 mg daily  2. Take lorazepam 0.5 mg daily as needed for anxiety  3. Continue Latuda 80 mg daily  4. Return to clinic in one month for 30 mins

## 2017-03-31 DIAGNOSIS — M67431 Ganglion, right wrist: Secondary | ICD-10-CM | POA: Diagnosis not present
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
## 2017-04-28 ENCOUNTER — Ambulatory Visit (HOSPITAL_COMMUNITY): Payer: 59 | Admitting: Psychiatry

## 2017-10-14 IMAGING — US US THYROID BIOPSY
1 series · 13 of 18 positions shown · non-contrast
Comparison: Thyroid ultrasound dated 03/22/2016

MEDICATIONS:
None

COMPLICATIONS:
None immediate.

INDICATION: Patient with history of solitary right solid/cystic thyroid nodule
with previous benign biopsy at [facility] several years ago;
follow-up thyroid ultrasound on 03/22/2016 revealed enlarged cystic
component of a solitary right thyroid nodule currently measuring
x 3.1 x 4.4 cm. Request now made for needle aspirate biopsy of this
right thyroid solid/cystic nodule.

EXAM:
ULTRASOUND GUIDED FINE NEEDLE ASPIRATION BIOPSY OF RIGHT MID THYROID
SOLID/CYSTIC NODULE
TECHNIQUE: Informed written consent was obtained from the patient after a
discussion of the risks, benefits and alternatives to treatment.
Questions regarding the procedure were encouraged and answered. A
timeout was performed prior to the initiation of the procedure.

[Series 1: us thyroid biopsy · 0.08mm/px · 18 acquisitions, 13 frames shown]
[im 1/18]
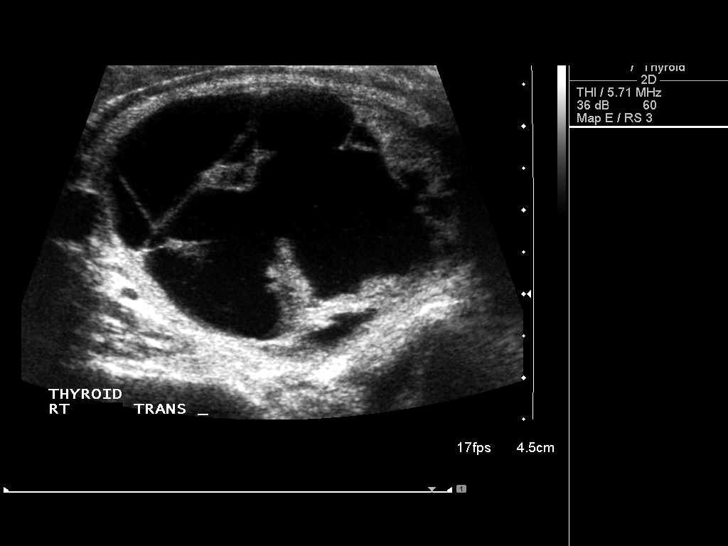
[im 3/18]
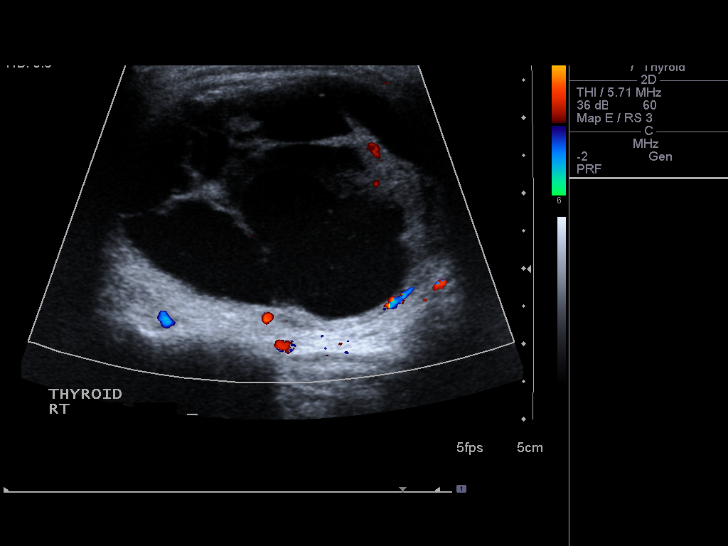
[im 4/18]
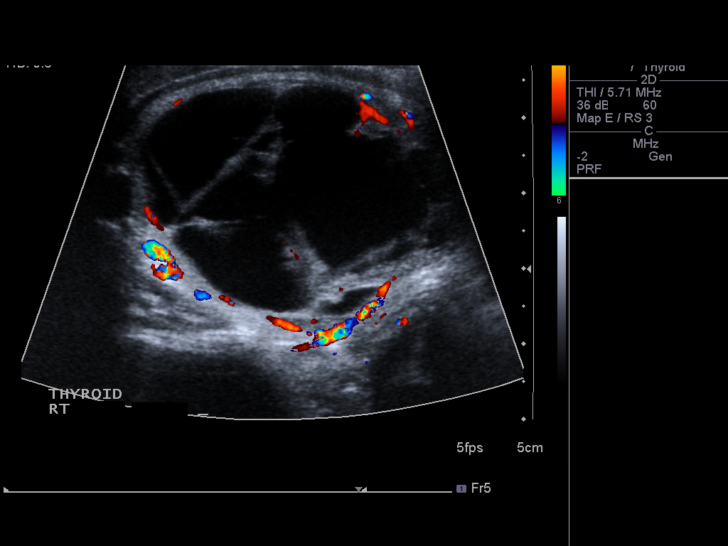
[im 5/18]
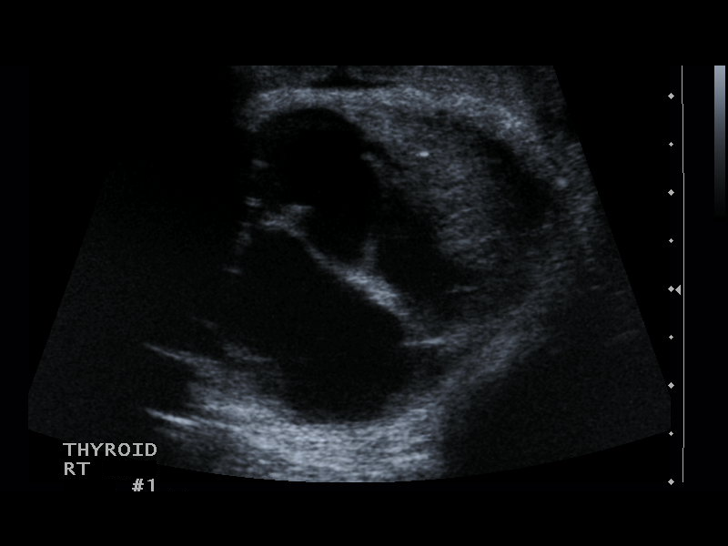
[im 7/18]
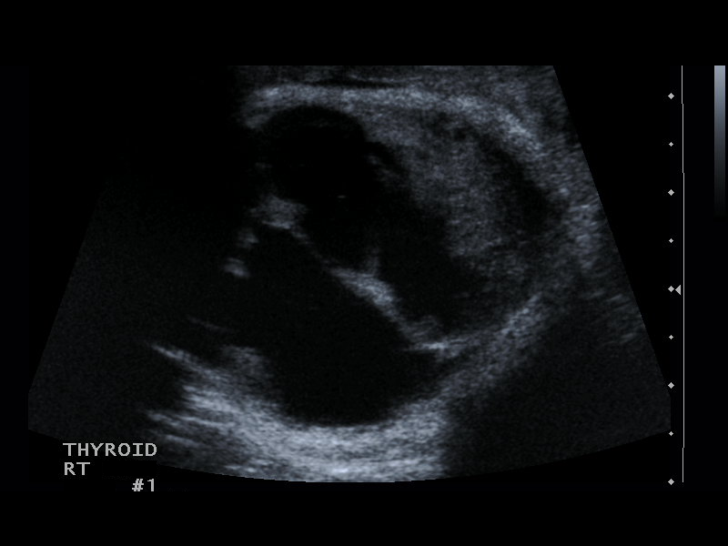
[im 8/18]
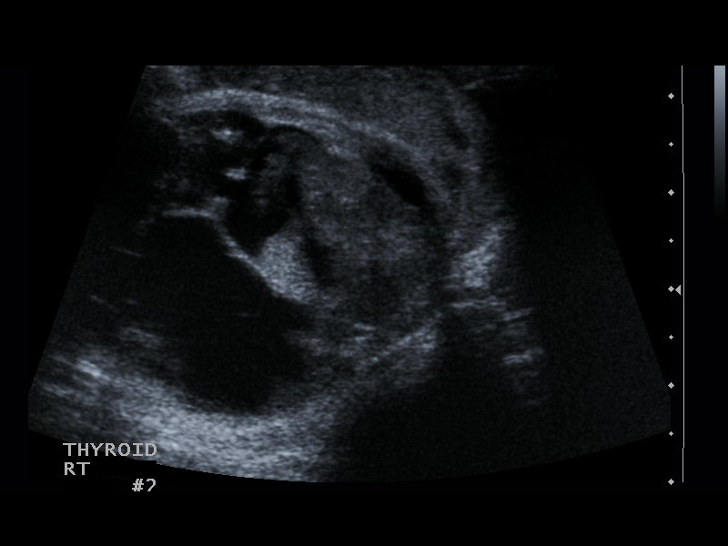
[im 10/18]
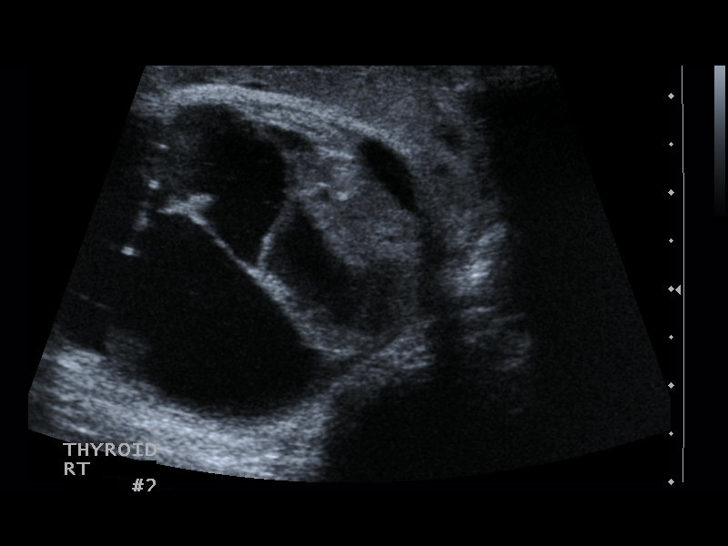
[im 11/18]
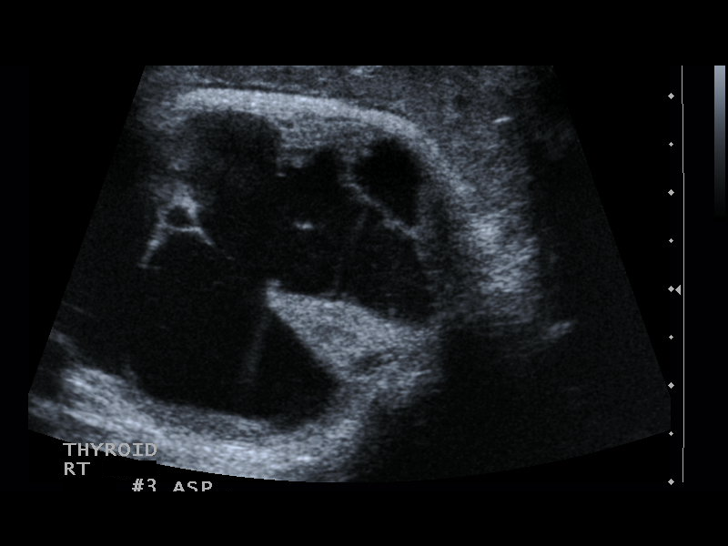
[im 12/18]
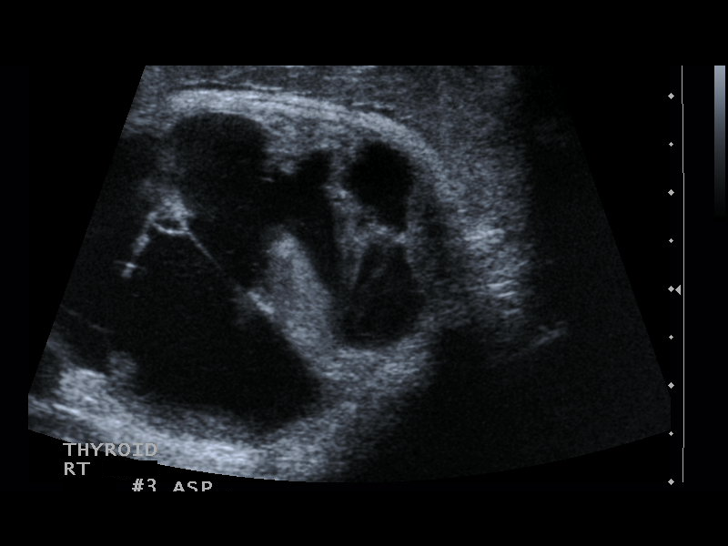
[im 14/18]
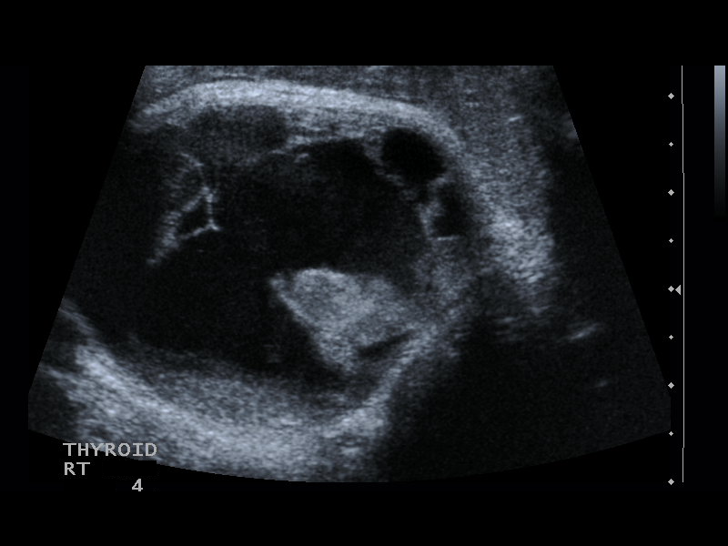
[im 15/18]
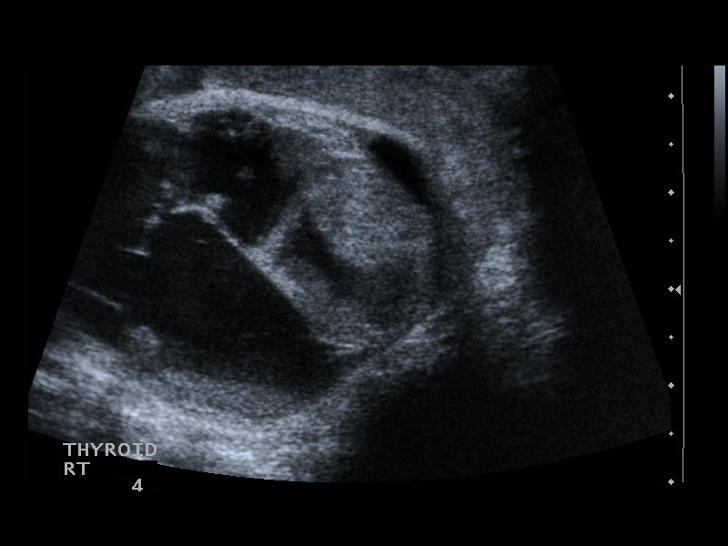
[im 16/18]
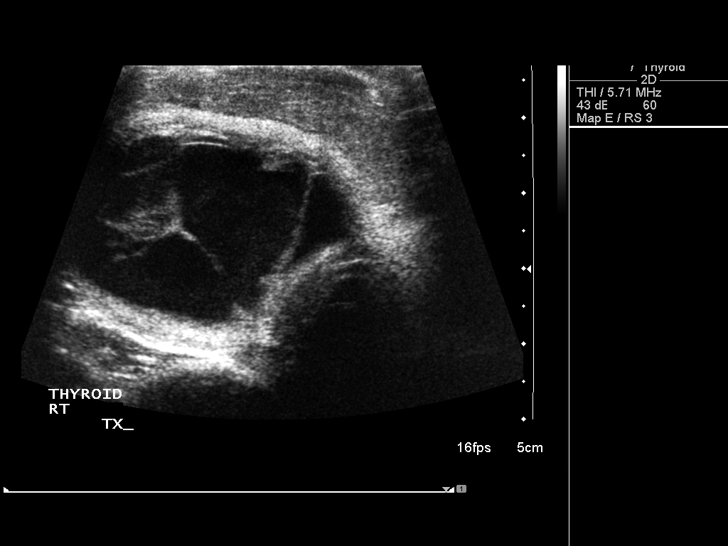
[im 18/18]
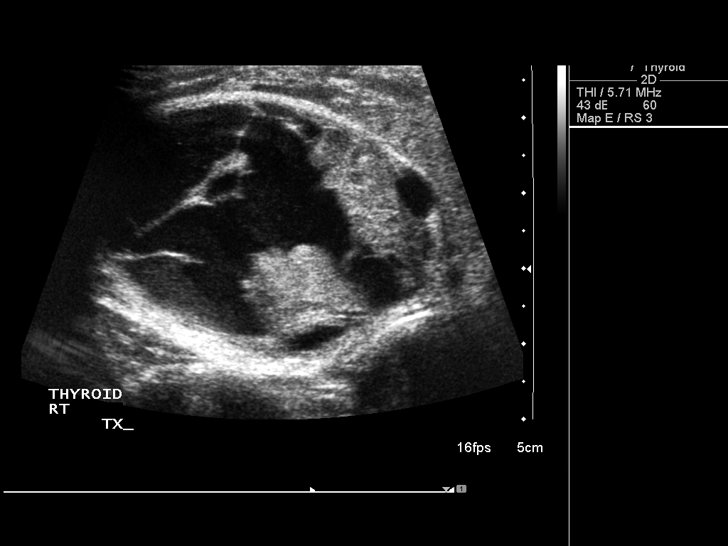

[13 of 18 positions shown; findings below may reference images not displayed]

Pre-procedural ultrasound scanning demonstrated right mid
solid/cystic nodule measuring 4.5 x 3.1 x 4.4 cm

The procedure was planned. The neck was prepped in the usual sterile
fashion, and a sterile drape was applied covering the operative
field. A timeout was performed prior to the initiation of the
procedure. Local anesthesia was provided with 1% lidocaine.

Under direct ultrasound guidance, 4 FNA biopsies were performed of
the right mid thyroid solid/cystic nodule with 25 gauge needles.
Multiple ultrasound images were saved for procedural documentation
purposes. The samples were prepared and submitted to pathology.
Approximately 7-10 cc of dark bloody fluid were aspirated from the
cystic component in addition to sampling of the solid component.

Limited post procedural scanning was negative for hematoma or
additional complication. Dressings were placed. The patient
tolerated the above procedures procedure well without immediate
postprocedural complication.
FINDINGS: FINDINGS
Nodule reference number based on prior diagnostic ultrasound: 1

Maximum size: 4.5 x 3.1 x 4.4 cm

Location: Right  ;  Mid

ACR TI-RADS total points: 3

ACR TI-RADS risk category:  TR3 (3 points)

Prior biopsy:  Yes

Reason for biopsy: meets ACR TI-RADS criteria

Ultrasound imaging confirms appropriate placement of the needles
within the thyroid nodule.
IMPRESSION: Technically successful ultrasound guided fine needle aspiration
biopsy of solitary right mid thyroid solid/ cystic nodule. Final
pathology pending.

## 2017-12-28 ENCOUNTER — Ambulatory Visit
Admission: RE | Admit: 2017-12-28 | Discharge: 2017-12-28 | Disposition: A | Discharge status: Home / Self Care | Payer: PRIVATE HEALTH INSURANCE | Source: Ambulatory Visit | Attending: Nurse Practitioner | Admitting: Nurse Practitioner

## 2017-12-28 ENCOUNTER — Other Ambulatory Visit: Payer: Self-pay | Admitting: Nurse Practitioner

## 2017-12-28 DIAGNOSIS — M25512 Pain in left shoulder: Secondary | ICD-10-CM

## 2017-12-28 DIAGNOSIS — M25511 Pain in right shoulder: Secondary | ICD-10-CM

## 2018-02-17 IMAGING — CR DG CHEST 2V
2 series · 2 of 2 positions shown · non-contrast
Comparison: 12/01/2012.

CLINICAL DATA: 35-year-old female. Preoperative exam thyroidectomy.
Initial encounter.

EXAM:
CHEST  2 VIEW

[w chest pa]
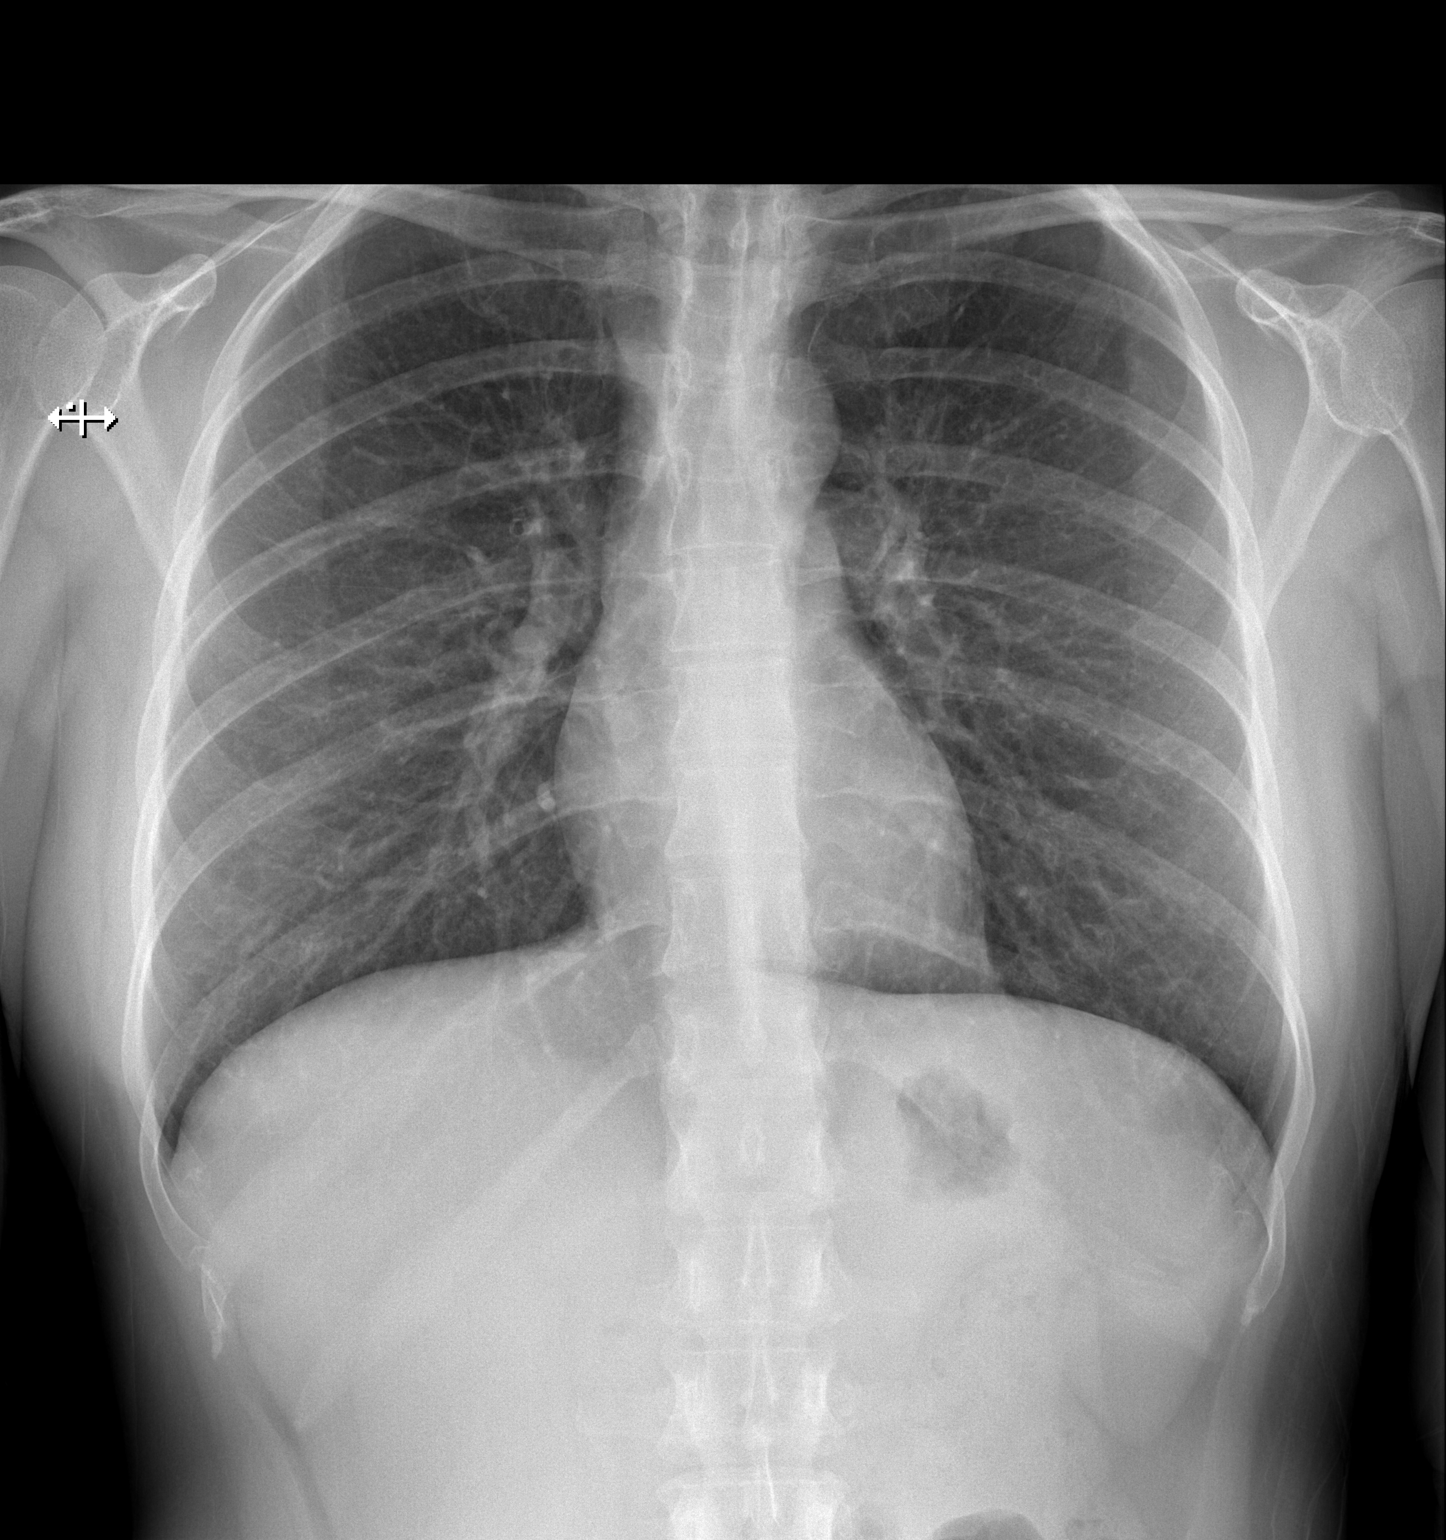

[w chest lat]
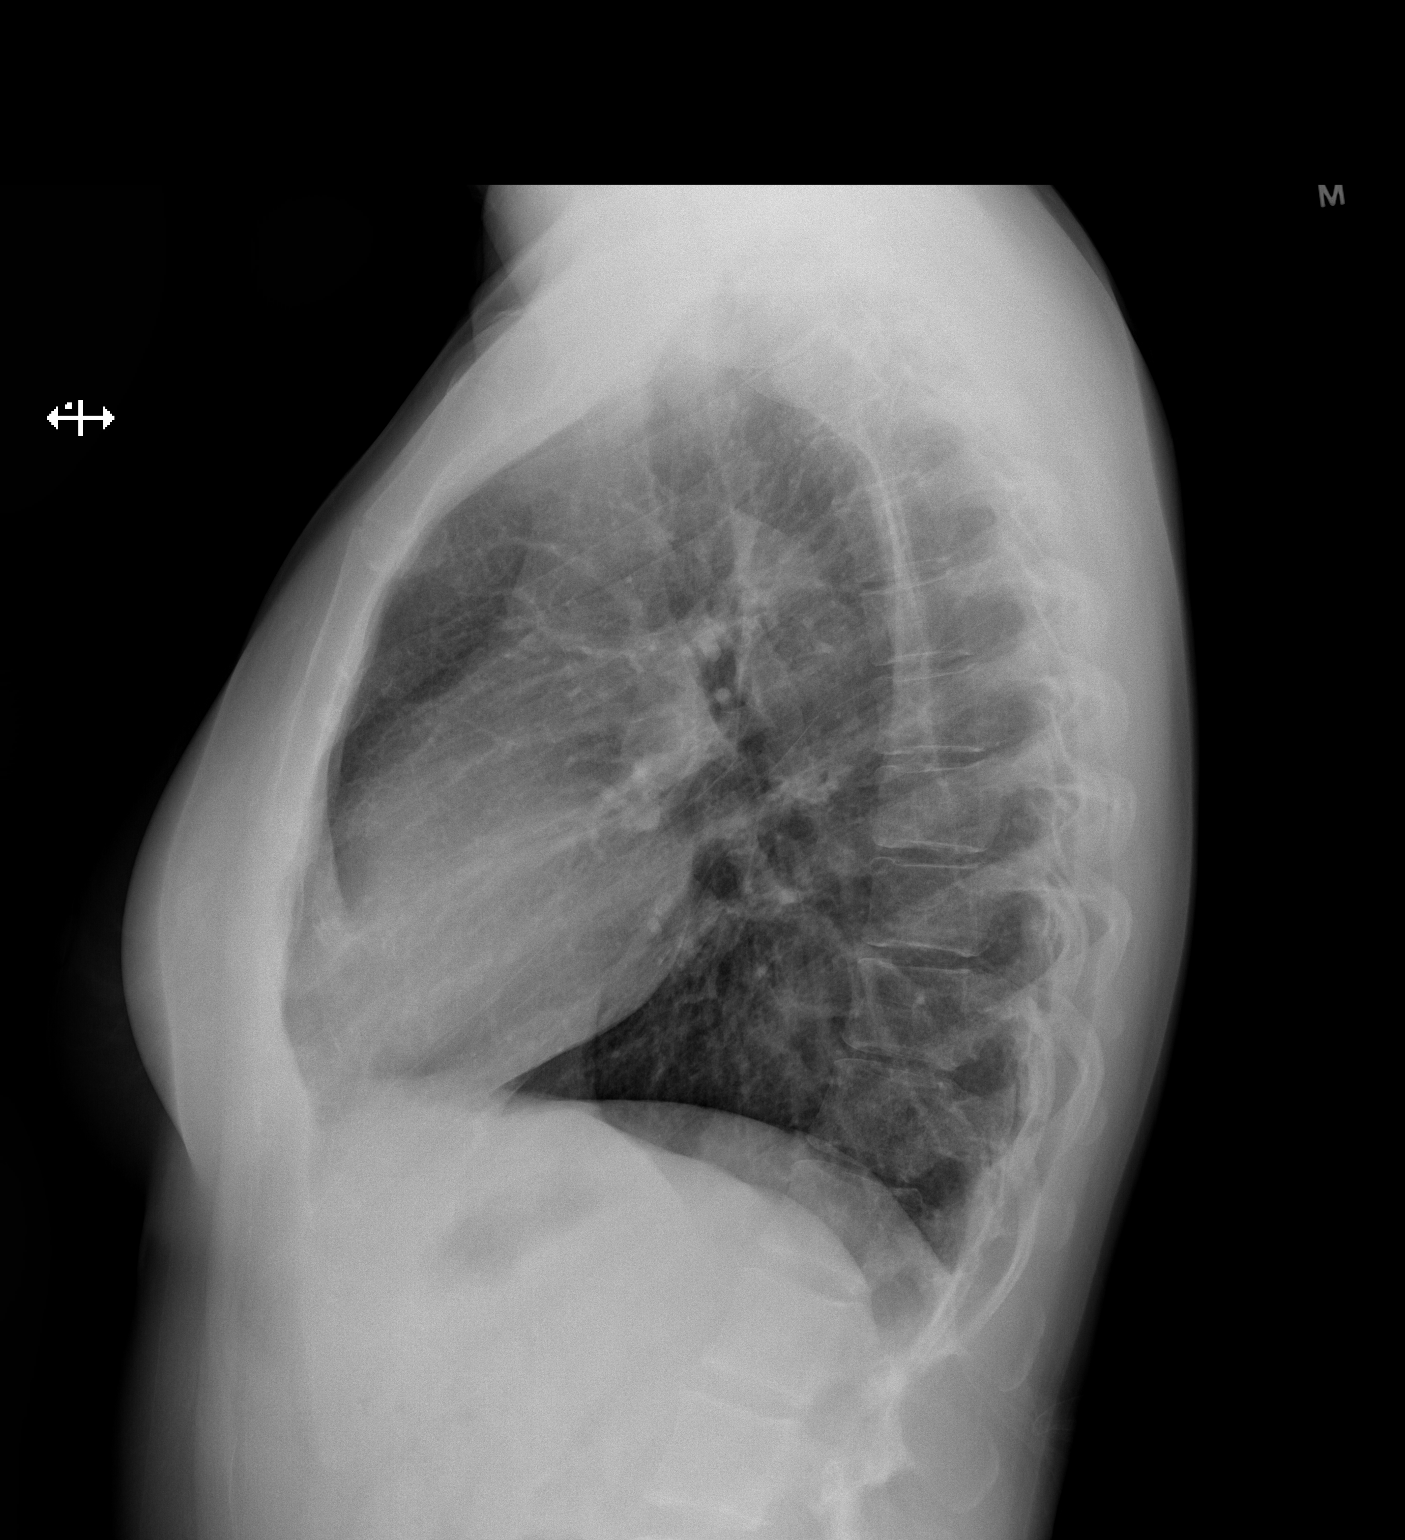

[2 of 2 positions shown; findings below may reference images not displayed]

FINDINGS: Minimal peribronchial thickening stable. No infiltrate, congestive
heart failure or pneumothorax.

No plain film evidence of pulmonary malignancy.

Impression upon the right aspect of trachea by thyroid gland.

Mild scoliosis.
IMPRESSION: No infiltrate or congestive heart failure.

Impression upon right aspect of the trachea by thyroid gland.

## 2018-07-06 DIAGNOSIS — Z6822 Body mass index (BMI) 22.0-22.9, adult: Secondary | ICD-10-CM | POA: Diagnosis not present

## 2018-10-05 DIAGNOSIS — Z6820 Body mass index (BMI) 20.0-20.9, adult: Secondary | ICD-10-CM | POA: Diagnosis not present

## 2018-10-05 DIAGNOSIS — N390 Urinary tract infection, site not specified: Secondary | ICD-10-CM | POA: Diagnosis not present

## 2018-12-25 ENCOUNTER — Telehealth (HOSPITAL_COMMUNITY): Payer: Self-pay | Admitting: *Deleted

## 2018-12-25 NOTE — Telephone Encounter (Signed)
DR HISADA PATIENT LVM WANTING TO SCHEDULE APPT. LAST APPT 04/2017 NO SHOW & BEFORE THAT LAST SEEN 04-08-2017. IS SHE STILL A CURRENT PATIENT? CAN A APPOINTMENT BE MADE?

## 2018-12-25 NOTE — Telephone Encounter (Signed)
I can see her for 45 mins follow up. Please remind her of no show policy.

## 2018-12-26 NOTE — Telephone Encounter (Signed)
LVM X 2 CALL BACK TO RESCHEDULE APPT

## 2019-03-13 ENCOUNTER — Encounter: Payer: Self-pay | Admitting: Gynecology

## 2020-03-12 ENCOUNTER — Telehealth: Payer: Self-pay | Admitting: Physician Assistant

## 2020-03-12 ENCOUNTER — Other Ambulatory Visit: Payer: Self-pay | Admitting: Physician Assistant

## 2020-03-12 DIAGNOSIS — U071 COVID-19: Secondary | ICD-10-CM

## 2020-03-12 DIAGNOSIS — Z720 Tobacco use: Secondary | ICD-10-CM

## 2020-03-12 DIAGNOSIS — Z6827 Body mass index (BMI) 27.0-27.9, adult: Secondary | ICD-10-CM

## 2020-03-12 NOTE — Telephone Encounter (Signed)
Called to discuss with patient about Covid symptoms and the use of casirivimab/imdevimab, a monoclonal antibody infusion for those with mild to moderate Covid symptoms and at a high risk of hospitalization.  Pt is qualified for this infusion at the [facility] due to; Specific high risk criteria : UNCLEAR. Does not appear to qualify based on chart review.   Message left to call back our hotline [phone].  Angelena Form PA-C  [facility]

## 2020-03-12 NOTE — Progress Notes (Signed)
I connected by phone with Tina Solis on 03/12/2020 at 6:57 PM to discuss the potential use of a new treatment for mild to moderate COVID-19 viral infection in non-hospitalized patients.  This patient is a 39 y.o. female that meets the FDA criteria for Emergency Use Authorization of COVID monoclonal antibody casirivimab/imdevimab.  Has a (+) direct SARS-CoV-2 viral test result  Has mild or moderate COVID-19   Is NOT hospitalized due to COVID-19  Is within 10 days of symptom onset  Has at least one of the high risk factor(s) for progression to severe COVID-19 and/or hospitalization as defined in EUA.  Specific high risk criteria : BMI > 25 and longstanding tobacco abuse   I have spoken and communicated the following to the patient or parent/caregiver regarding COVID monoclonal antibody treatment:  1. FDA has authorized the emergency use for the treatment of mild to moderate COVID-19 in adults and pediatric patients with positive results of direct SARS-CoV-2 viral testing who are 59 years of age and older weighing at least 40 kg, and who are at high risk for progressing to severe COVID-19 and/or hospitalization.  2. The significant known and potential risks and benefits of COVID monoclonal antibody, and the extent to which such potential risks and benefits are unknown.  3. Information on available alternative treatments and the risks and benefits of those alternatives, including clinical trials.  4. Patients treated with COVID monoclonal antibody should continue to self-isolate and use infection control measures (e.g., wear mask, isolate, social distance, avoid sharing personal items, clean and disinfect "high touch" surfaces, and frequent handwashing) according to CDC guidelines.   5. The patient or parent/caregiver has the option to accept or refuse COVID monoclonal antibody treatment.  After reviewing this information with the patient, The patient agreed to proceed with receiving  casirivimab\imdevimab infusion and will be provided a copy of the Fact sheet prior to receiving the infusion.  Sx onset 9/20. Set up for infusion on 9/24 @. 8:30am Directions given to [facility]. Pt is aware that insurance will be charged an infusion fee. Pt is unvaccinated.   Tina Solis 03/12/2020 6:57 PM

## 2020-03-14 ENCOUNTER — Other Ambulatory Visit (HOSPITAL_COMMUNITY): Payer: Self-pay

## 2020-03-14 ENCOUNTER — Ambulatory Visit (HOSPITAL_COMMUNITY)
Admission: RE | Admit: 2020-03-14 | Discharge: 2020-03-14 | Disposition: A | Discharge status: Home / Self Care | Payer: PRIVATE HEALTH INSURANCE | Source: Ambulatory Visit | Attending: Pulmonary Disease | Admitting: Pulmonary Disease

## 2020-03-14 DIAGNOSIS — Z6827 Body mass index (BMI) 27.0-27.9, adult: Secondary | ICD-10-CM | POA: Insufficient documentation

## 2020-03-14 DIAGNOSIS — Z720 Tobacco use: Secondary | ICD-10-CM

## 2020-03-14 DIAGNOSIS — U071 COVID-19: Secondary | ICD-10-CM | POA: Diagnosis present

## 2020-03-14 MED ORDER — SODIUM CHLORIDE 0.9 % IV SOLN: INTRAVENOUS | Status: DC | PRN

## 2020-03-14 MED ORDER — ALBUTEROL SULFATE HFA 108 (90 BASE) MCG/ACT IN AERS: 2.0000 | INHALATION_SPRAY | Freq: Once | RESPIRATORY_TRACT | Status: DC | PRN

## 2020-03-14 MED ORDER — METHYLPREDNISOLONE SODIUM SUCC 125 MG IJ SOLR: 125.0000 mg | Freq: Once | INTRAMUSCULAR | Status: DC | PRN

## 2020-03-14 MED ORDER — SODIUM CHLORIDE 0.9 % IV SOLN
1200.0000 mg | Freq: Once | INTRAVENOUS | Status: AC
  Administered 2020-03-14: 09:00:00 1200 mg via INTRAVENOUS

## 2020-03-14 MED ORDER — FAMOTIDINE IN NACL 20-0.9 MG/50ML-% IV SOLN: 20.0000 mg | Freq: Once | INTRAVENOUS | Status: DC | PRN

## 2020-03-14 MED ORDER — EPINEPHRINE 0.3 MG/0.3ML IJ SOAJ: 0.3000 mg | Freq: Once | INTRAMUSCULAR | Status: DC | PRN

## 2020-03-14 MED ORDER — DIPHENHYDRAMINE HCL 50 MG/ML IJ SOLN: 50.0000 mg | Freq: Once | INTRAMUSCULAR | Status: DC | PRN

## 2020-03-14 NOTE — Progress Notes (Signed)
  Diagnosis: COVID-19  Physician: Dr Joya Gaskins  Procedure: [facility] Infusion Clinic Med: casirivimab\imdevimab infusion - Provided patient with casirivimab\imdevimab fact sheet for patients, parents and caregivers prior to infusion.  Complications: No immediate complications noted.  Discharge: Discharged home   Tina Solis 03/14/2020

## 2020-03-14 NOTE — Discharge Instructions (Signed)

## 2021-11-24 ENCOUNTER — Other Ambulatory Visit: Payer: Self-pay | Admitting: Orthopedic Surgery

## 2021-11-26 ENCOUNTER — Encounter: Payer: Self-pay | Admitting: *Deleted

## 2021-11-26 NOTE — Progress Notes (Signed)
Referring:  Leonides Sake, MD [address] [place],  [place] [address]  PCP: Cyndi Bender, PA-C  Neurology was asked to evaluate Tina Solis, a 41 year old female for a chief complaint of headaches.  Our recommendations of care will be communicated by shared medical record.    CC:  headaches  History provided from self  HPI:  Medical co-morbidities: thyroid mass s/p right thyroidectomy, PTSD, anxiety, depression  The patient presents for evaluation of headaches which began one year ago. Headaches started one day without clear onset and never went away. They are a constant 4/10 pain. She does endorse mild photophobia and phonophobia. Has nausea at baseline, not sure if this is associated with headache or not. Headaches are exacerbated by coughing and sneezing. She has tried ibuprofen and Excedrin without improvement. Does not take these often as they are not helpful.  States before her headaches she had also a constellation of symptoms which began 5 years ago. Initially she developed generalized body pain. She was though to have depression so she was started on Latuda. This helped her body pain but caused tardive dyskinesia so stopped it. Now she continues to have body pain but it is not as severe. She also had stiffness and paresthesias in her feet. Has this slightly in her hands as well. Feels like they are falling asleep. She did have these symptoms around the time she was found to have a thyroid mass and had her right thyroid removed.  Notes that she had COVID in 2021 but does not feel symptoms correlate with this.  She is highly anxious that she has SLE due to her family history. So far autoimmune testing has been negative.  Headache History: Onset: 1 year ago Triggers: sneezing, coughing, loud music Aura: out of focus Location: vertex Quality/Description: Associated Symptoms:  Photophobia: yes  Phonophobia: yes  Nausea: nauseated at baseline Worse with activity?:  yes Duration of headaches: constant  Headache days per month: 30 Headache free days per month: 0  Current Treatment: Abortive Excedrin  Preventative none  Prior Therapies                                 Excedrin ibuprofen  Headache Risk Factors: Headache risk factors and/or co-morbidities  LABS: A1c, B12, TSH wnl  IMAGING:  MRI brain 11/18/21 with multiple small pineal cysts (4 mm), 10 mm mucous retention cyst  No current outpatient medications on file prior to visit.   No current facility-administered medications on file prior to visit.     Allergies: Allergies  Allergen Reactions   Penicillins Hives and Itching    Has patient had a PCN reaction causing immediate rash, facial/tongue/throat swelling, SOB or lightheadedness with hypotension:Yes Has patient had a PCN reaction causing severe rash involving mucus membranes or skin necrosis: Yes Has patient had a PCN reaction that required hospitalization:No Has patient had a PCN reaction occurring within the last 10 years:No If all of the above answers are "NO", then may proceed with Cephalosporin use.    Wellbutrin [Bupropion]     Made depression worse    Family History: Migraine or other headaches in the family:  yes Aneurysms in a first degree relative:  no Brain tumors in the family:  no Other neurological illness in the family:   no  Past Medical History: Past Medical History:  Diagnosis Date   Bipolar depression ([facility])    Depression  Dizziness    New persistent daily headache     Past Surgical History Past Surgical History:  Procedure Laterality Date   APPENDECTOMY     INTRAUTERINE DEVICE INSERTION  07/16/2016   Mirena   THYROIDECTOMY Right 08/06/2016   Procedure: RIGHT THYROIDECTOMY;  Surgeon: Rozetta Nunnery, MD;  Location: [facility];  Service: ENT;  Laterality: Right;   WISDOM TOOTH EXTRACTION      Social History: Social History   Tobacco Use   Smoking status: Former    Packs/day: 0.50     Types: Cigarettes    Quit date: 05/21/2021    Years since quitting: 0.5   Smokeless tobacco: Never  Substance Use Topics   Alcohol use: Yes    Comment: SOCIAL, 10-06-2016 per pt social   Drug use: Yes    Types: Marijuana    Comment: 10-06-2016 per pt marijuana on occa     ROS: Negative for fevers, chills. Positive for headaches, fatigue, body pain, paresthesias. All other systems reviewed and negative unless stated otherwise in HPI.   Physical Exam:   Vital Signs: BP (!) 157/100   Pulse (!) 111   Ht '5\' 2"'$  (1.575 m)   Wt 158 lb (71.7 kg)   BMI 28.90 kg/m  GENERAL: well appearing,in no acute distress,alert SKIN:  Color, texture, turgor normal. No rashes or lesions HEAD:  Normocephalic/atraumatic. CV:  RRR RESP: Normal respiratory effort MSK: +tenderness to palpation over bilateral neck and shoulders  NEUROLOGICAL: Mental Status: Alert, oriented to person, place and time,Follows commands. Highly anxious, tearful  Cranial Nerves: PERRL, visual fields intact to confrontation, extraocular movements intact, facial sensation intact, no facial droop or ptosis, hearing grossly intact, no dysarthria Motor: muscle strength 5/5 both upper and lower extremities Reflexes: 2+ throughout Sensation: decreased sensation to pinprick over left foot Coordination: Finger-to- nose-finger intact bilaterally Gait: normal-based   IMPRESSION: 41 year old female with a history of thyroid mass s/p right thyroidectomy, PTSD, anxiety, depression who presents for evaluation of daily headaches for the past year. Recent MRI was unremarkable. Went over results and discussed how pineal cysts are likely an incidental finding and not contributing to her symptoms. Her headache pattern is most consistent with new daily persistent headache. Will order CTA as she does report an exertional component to the headache. Discussed that we do not find a cause of NDPH in 50% of cases. Will complete neuropathy blood work  today. If blood work is normal, may consider EMG for paresthesias. She is highly anxious which may be exacerbating her current symptoms. Will start amitriptyline at bedtime for headaches, body pain, and paresthesias.  PLAN: -CTA head -Start amitriptyline for headache prevention. Take 12.5 mg QHS x1 week then increase to 25 mg QHS -Blood work: SPEP, IFE -next steps: Consider cymbalta, propranolol for headache prevention. Consider EMG for paresthesias   I spent a total of 42 minutes chart reviewing and counseling the patient. Headache education was done. Discussed treatment options including preventive medications. Discussed medication side effects, adverse reactions and drug interactions. Written educational materials and patient instructions outlining all of the above were given.  Follow-up: 5 months   Genia Harold, MD 11/27/2021   10:12 AM

## 2021-11-27 ENCOUNTER — Encounter: Payer: Self-pay | Admitting: Psychiatry

## 2021-11-27 ENCOUNTER — Ambulatory Visit (INDEPENDENT_AMBULATORY_CARE_PROVIDER_SITE_OTHER): Payer: Managed Care, Other (non HMO) | Admitting: Psychiatry

## 2021-11-27 ENCOUNTER — Telehealth: Payer: Self-pay | Admitting: Psychiatry

## 2021-11-27 VITALS — BP 157/100 | HR 111 | Ht 62.0 in | Wt 158.0 lb

## 2021-11-27 DIAGNOSIS — G629 Polyneuropathy, unspecified: Secondary | ICD-10-CM | POA: Diagnosis not present

## 2021-11-27 DIAGNOSIS — G4452 New daily persistent headache (NDPH): Secondary | ICD-10-CM

## 2021-11-27 DIAGNOSIS — G4483 Primary cough headache: Secondary | ICD-10-CM | POA: Diagnosis not present

## 2021-11-27 MED ORDER — AMITRIPTYLINE HCL 25 MG PO TABS: ORAL_TABLET | ORAL | 6 refills | Status: DC

## 2021-11-27 NOTE — Telephone Encounter (Signed)
[employer] sent to GI they obtain auth and call  patient to schedule

## 2021-11-27 NOTE — Patient Instructions (Addendum)
Plan: -CT scan to look at blood vessels of the head -Blood work today to look for causes of neuropathy -Start amitriptyline at bedtime for headaches and body pain. Take 1/2 pill at bedtime for one week, then increase to 1 pill at bedtime  Your headache is consistent with new daily persistent headache (NDPH). This is a headache that begins one day and continues daily for at least 3 months. People with NDPH can often recall the exact day when the pain began. About 50% of those with NDPH will have associated migrainous features including throbbing pain, sensitivity to light, sensitivity to sound, or nausea. This can occur at any age, and is 2-3 times more common in women. It is sometimes preceded by a flu-like illness or a cold, a stressful life event, or a surgery. However, there is no clear inciting event in at least 50% of NDPH cases. Imaging of the brain should be done to rule out secondary causes of the headache, but imaging will be normal in most cases of NDPH.  Often people with NDPH will have a component of medication overuse headache from overuse of over the counter pain medications like ibuprofen or tylenol. We recommend limiting the use of over the counter medications to less than 2 days per week or 10 days per month. Using these medications more frequently can contribute to the headache and even make it worse.  We use daily preventative medications to treat NDPH. Lifestyle changes such as maintaining regular sleep, exercise, and a good diet can also help reduce headaches. Other non-medication options include behavioral therapy and biofeedback.

## 2021-12-03 ENCOUNTER — Other Ambulatory Visit: Payer: PRIVATE HEALTH INSURANCE

## 2021-12-08 LAB — MULTIPLE MYELOMA PANEL, SERUM
Albumin SerPl Elph-Mcnc: 4.2 g/dL (ref 2.9–4.4)
Albumin/Glob SerPl: 1.7 (ref 0.7–1.7)
Alpha 1: 0.2 g/dL (ref 0.0–0.4)
Alpha2 Glob SerPl Elph-Mcnc: 0.6 g/dL (ref 0.4–1.0)
B-Globulin SerPl Elph-Mcnc: 0.9 g/dL (ref 0.7–1.3)
Gamma Glob SerPl Elph-Mcnc: 0.9 g/dL (ref 0.4–1.8)
Globulin, Total: 2.6 g/dL (ref 2.2–3.9)
IgA/Immunoglobulin A, Serum: 131 mg/dL (ref 87–352)
IgG (Immunoglobin G), Serum: 886 mg/dL (ref 586–1602)
IgM (Immunoglobulin M), Srm: 187 mg/dL (ref 26–217)
Total Protein: 6.8 g/dL (ref 6.0–8.5)

## 2021-12-18 ENCOUNTER — Other Ambulatory Visit: Payer: Self-pay

## 2021-12-18 ENCOUNTER — Encounter (HOSPITAL_BASED_OUTPATIENT_CLINIC_OR_DEPARTMENT_OTHER): Payer: Self-pay | Admitting: Orthopedic Surgery

## 2021-12-19 ENCOUNTER — Other Ambulatory Visit: Payer: Self-pay | Admitting: Psychiatry

## 2021-12-29 ENCOUNTER — Inpatient Hospital Stay: Admission: RE | Admit: 2021-12-29 | Payer: Managed Care, Other (non HMO) | Source: Ambulatory Visit

## 2022-01-01 ENCOUNTER — Ambulatory Visit (HOSPITAL_BASED_OUTPATIENT_CLINIC_OR_DEPARTMENT_OTHER)
Admission: RE | Admit: 2022-01-01 | Payer: Managed Care, Other (non HMO) | Source: Home / Self Care | Admitting: Orthopedic Surgery

## 2022-01-01 DIAGNOSIS — F331 Major depressive disorder, recurrent, moderate: Secondary | ICD-10-CM

## 2022-01-01 DIAGNOSIS — Z01818 Encounter for other preprocedural examination: Secondary | ICD-10-CM

## 2022-01-01 SURGERY — EXCISION, GANGLION CYST, WRIST
Anesthesia: Choice | Site: Wrist | Laterality: Right

## 2022-05-05 NOTE — Progress Notes (Unsigned)
   CC:  headaches  Follow-up Visit  Last visit: 11/27/21  Brief HPI: 41 year old female with a history of thyroid mass s/p right thyroidectomy, PTSD, anxiety, depression who follows in clinic for NDPH. MRI brain 10/31/21 with multiple small pineal cysts, otherwise unremarkable.  At her last visit she was started amitriptyline for headache prevention.  Interval History: Headaches have improved since her last visit. Amitriptyline 25 mg QHS has reduced to her headaches to less than once per week. Headaches are mild when she does get them and typically resolve with Tylenol.  She was started on hydroxychloroquine by Rheumatology and hopes this will help with her paresthesias and body pain. She was also started on Cymbalta 30 mg daily by her PCP which seems to have helped.  Headache days per month: 2 Headache free days per month: 28  Current Headache Regimen: Preventative: amitriptyline 25 mg QHS Abortive: Tylenol   Prior Therapies                                  Excedrin Ibuprofen Amitriptyline 25 mg QHS Cymbalta 30 mg daily  Physical Exam:   Vital Signs: BP (!) 132/95   Pulse (!) 112   Ht '5\' 2"'$  (1.575 m)   Wt 160 lb 4 oz (72.7 kg)   BMI 29.31 kg/m  GENERAL:  well appearing, in no acute distress, alert  SKIN:  Color, texture, turgor normal. No rashes or lesions HEAD:  Normocephalic/atraumatic. RESP: normal respiratory effort MSK:  No gross joint deformities.   NEUROLOGICAL: Mental Status: Alert, oriented to person, place and time, Follows commands, and Speech fluent and appropriate. Cranial Nerves: PERRL, face symmetric, no dysarthria, hearing grossly intact Motor: moves all extremities equally Gait: normal-based.  IMPRESSION: 41 year old female with a history of thyroid mass s/p right thyroidectomy, PTSD, anxiety, and depression who presents for follow up of NDPH. Her headaches have improved significantly with amitriptyline. Will continue current regimen for now. If  headaches remain well-controlled over the next 6 months may consider trial of weaning off amitriptyline.  PLAN: -Prevention: Continue amitriptyline 25 mg QHS -Rescue: Tylenol -Next steps: consider PRN diclofenac for rescue  Follow-up: 6 months  I spent a total of 14 minutes on the date of the service. Headache education was done. Discussed medication side effects, adverse reactions and drug interactions. Written educational materials and patient instructions outlining all of the above were given.  Genia Harold, MD 05/06/22 8:49 AM

## 2022-05-06 ENCOUNTER — Ambulatory Visit (INDEPENDENT_AMBULATORY_CARE_PROVIDER_SITE_OTHER): Payer: Managed Care, Other (non HMO) | Admitting: Psychiatry

## 2022-05-06 VITALS — BP 132/95 | HR 112 | Ht 62.0 in | Wt 160.2 lb

## 2022-05-06 DIAGNOSIS — G4452 New daily persistent headache (NDPH): Secondary | ICD-10-CM | POA: Diagnosis not present

## 2022-07-27 ENCOUNTER — Other Ambulatory Visit: Payer: Self-pay | Admitting: Family Medicine

## 2022-07-27 DIAGNOSIS — M549 Dorsalgia, unspecified: Secondary | ICD-10-CM

## 2022-08-05 ENCOUNTER — Ambulatory Visit
Admission: RE | Admit: 2022-08-05 | Discharge: 2022-08-05 | Disposition: A | Discharge status: Home / Self Care | Payer: Managed Care, Other (non HMO) | Source: Ambulatory Visit | Attending: Family Medicine | Admitting: Family Medicine

## 2022-08-05 DIAGNOSIS — G8929 Other chronic pain: Secondary | ICD-10-CM

## 2022-11-04 NOTE — Progress Notes (Deleted)
No chief complaint on file.   HISTORY OF PRESENT ILLNESS:  11/04/22 ALL:  Tina Solis is a 42 y.o. female here today for follow up for headaches. She was last seen by Dr Delena Bali 04/2022 and reports headaches were improved and stable on amitriptyline 25mg  QHS. Since,    HISTORY (copied from Dr Quentin Mulling previous note)  Brief HPI: 42 year old female with a history of thyroid mass s/p right thyroidectomy, PTSD, anxiety, depression who follows in clinic for NDPH. MRI brain 10/31/21 with multiple small pineal cysts, otherwise unremarkable.   At her last visit she was started amitriptyline for headache prevention.   Interval History: Headaches have improved since her last visit. Amitriptyline 25 mg QHS has reduced to her headaches to less than once per week. Headaches are mild when she does get them and typically resolve with Tylenol.   She was started on hydroxychloroquine by Rheumatology and hopes this will help with her paresthesias and body pain. She was also started on Cymbalta 30 mg daily by her PCP which seems to have helped.   Headache days per month: 2 Headache free days per month: 28   Current Headache Regimen: Preventative: amitriptyline 25 mg QHS Abortive: Tylenol     Prior Therapies                                  Excedrin Ibuprofen Amitriptyline 25 mg QHS Cymbalta 30 mg daily   REVIEW OF SYSTEMS: Out of a complete 14 system review of symptoms, the patient complains only of the following symptoms, headaches and all other reviewed systems are negative.   ALLERGIES: Allergies  Allergen Reactions   Penicillins Hives and Itching    Has patient had a PCN reaction causing immediate rash, facial/tongue/throat swelling, SOB or lightheadedness with hypotension:Yes Has patient had a PCN reaction causing severe rash involving mucus membranes or skin necrosis: Yes Has patient had a PCN reaction that required hospitalization:No Has patient had a PCN reaction occurring  within the last 10 years:No If all of the above answers are "NO", then may proceed with Cephalosporin use.    Wellbutrin [Bupropion]     Made depression worse     HOME MEDICATIONS: Outpatient Medications Prior to Visit  Medication Sig Dispense Refill   amitriptyline (ELAVIL) 25 MG tablet TAKE 1/2 TABLET BY MOUTH AT BEDTIME X 1 WEEK. THEN 1 TABLET AT BEDTIME 90 tablet 3   DULoxetine (CYMBALTA) 30 MG capsule Take 90 mg by mouth daily.     hydroxychloroquine (PLAQUENIL) 200 MG tablet Take by mouth.     No facility-administered medications prior to visit.     PAST MEDICAL HISTORY: Past Medical History:  Diagnosis Date   Bipolar depression ([facility])    Depression    Dizziness    New persistent daily headache      PAST SURGICAL HISTORY: Past Surgical History:  Procedure Laterality Date   APPENDECTOMY     INTRAUTERINE DEVICE INSERTION  07/16/2016   Mirena   THYROIDECTOMY Right 08/06/2016   Procedure: RIGHT THYROIDECTOMY;  Surgeon: Drema Halon, MD;  Location: [facility];  Service: ENT;  Laterality: Right;   WISDOM TOOTH EXTRACTION       FAMILY HISTORY: Family History  Problem Relation Age of Onset   Diabetes Father    Lupus Paternal Grandfather    Lupus Paternal Aunt    Lupus Other      SOCIAL HISTORY: Social  History   Socioeconomic History   Marital status: Married    Spouse name: Not on file   Number of children: 2   Years of education: Not on file   Highest education level: Not on file  Occupational History   Not on file  Tobacco Use   Smoking status: Former    Packs/day: .5    Types: Cigarettes    Quit date: 05/21/2021    Years since quitting: 1.4   Smokeless tobacco: Never  Substance and Sexual Activity   Alcohol use: Yes    Comment: SOCIAL, 10-06-2016 per pt social   Drug use: Not Currently    Types: Marijuana    Comment: 10-06-2016 per pt marijuana on occa   Sexual activity: Yes    Partners: Male    Birth control/protection: I.U.D.    Comment:  1ST INTERCOURSE- 63, PARTNERS- 9 Mirena IUD 06/2016  Other Topics Concern   Not on file  Social History Narrative   Lives with family   Caffeine- 3 a day   Social Determinants of Health   Financial Resource Strain: Not on file  Food Insecurity: Not on file  Transportation Needs: Not on file  Physical Activity: Not on file  Stress: Not on file  Social Connections: Not on file  Intimate Partner Violence: Not on file     PHYSICAL EXAM  There were no vitals filed for this visit. There is no height or weight on file to calculate BMI.  Generalized: Well developed, in no acute distress  Cardiology: normal rate and rhythm, no murmur auscultated  Respiratory: clear to auscultation bilaterally    Neurological examination  Mentation: Alert oriented to time, place, history taking. Follows all commands speech and language fluent Cranial nerve II-XII: Pupils were equal round reactive to light. Extraocular movements were full, visual field were full on confrontational test. Facial sensation and strength were normal. Uvula tongue midline. Head turning and shoulder shrug  were normal and symmetric. Motor: The motor testing reveals 5 over 5 strength of all 4 extremities. Good symmetric motor tone is noted throughout.  Sensory: Sensory testing is intact to soft touch on all 4 extremities. No evidence of extinction is noted.  Coordination: Cerebellar testing reveals good finger-nose-finger and heel-to-shin bilaterally.  Gait and station: Gait is normal. Tandem gait is normal. Romberg is negative. No drift is seen.  Reflexes: Deep tendon reflexes are symmetric and normal bilaterally.    DIAGNOSTIC DATA (LABS, IMAGING, TESTING) - I reviewed patient records, labs, notes, testing and imaging myself where available.  Lab Results  Component Value Date   WBC 12.1 (H) 08/03/2016   HGB 16.0 (H) 08/03/2016   HCT 47.7 (H) 08/03/2016   MCV 83.4 08/03/2016   PLT 227 08/03/2016      Component Value  Date/Time   NA 137 08/03/2016 0847   NA 137 03/11/2014 1949   K 4.3 08/03/2016 0847   K 3.6 03/11/2014 1949   CL 103 08/03/2016 0847   CL 105 03/11/2014 1949   CO2 23 08/03/2016 0847   CO2 24 03/11/2014 1949   GLUCOSE 83 08/03/2016 0847   GLUCOSE 89 03/11/2014 1949   BUN 8 08/03/2016 0847   BUN 9 03/11/2014 1949   CREATININE 0.86 08/03/2016 0847   CREATININE 0.80 03/11/2014 1949   CALCIUM 9.4 08/03/2016 0847   CALCIUM 8.2 (L) 03/11/2014 1949   PROT 6.8 11/27/2021 1003   PROT 6.8 03/11/2014 1949   ALBUMIN 4.2 08/03/2016 0847   ALBUMIN 3.6 03/11/2014 1949  AST 25 08/03/2016 0847   AST 12 (L) 03/11/2014 1949   ALT 15 08/03/2016 0847   ALT 13 (L) 03/11/2014 1949   ALKPHOS 46 08/03/2016 0847   ALKPHOS 50 03/11/2014 1949   BILITOT 0.7 08/03/2016 0847   BILITOT 0.5 03/11/2014 1949   GFRNONAA >60 08/03/2016 0847   GFRNONAA >60 03/11/2014 1949   GFRAA >60 08/03/2016 0847   GFRAA >60 03/11/2014 1949   Lab Results  Component Value Date   CHOL 194 07/06/2016   HDL 48 (L) 07/06/2016   LDLCALC 120 (H) 07/06/2016   TRIG 128 07/06/2016   CHOLHDL 4.0 07/06/2016   No results found for: "HGBA1C" No results found for: "VITAMINB12" Lab Results  Component Value Date   TSH 0.878 12/01/2012        No data to display               No data to display           ASSESSMENT AND PLAN  42 y.o. year old female  has a past medical history of Bipolar depression ([facility]), Depression, Dizziness, and New persistent daily headache. here with    No diagnosis found.  [place] ***.  Healthy lifestyle habits encouraged. *** will follow up with PCP as directed. *** will return to see me in ***, sooner if needed. *** verbalizes understanding and agreement with this plan.   No orders of the defined types were placed in this encounter.    No orders of the defined types were placed in this encounter.    Shawnie Dapper, MSN, FNP-C 11/04/2022, 4:21 PM  [facility] [address] [place], [place] [address] [phone]

## 2022-11-09 ENCOUNTER — Ambulatory Visit: Payer: Managed Care, Other (non HMO) | Admitting: Family Medicine

## 2022-11-09 DIAGNOSIS — G44229 Chronic tension-type headache, not intractable: Secondary | ICD-10-CM

## 2022-12-22 ENCOUNTER — Other Ambulatory Visit: Payer: Self-pay | Admitting: Psychiatry

## 2023-01-17 NOTE — Progress Notes (Signed)
PATIENT: Tina Solis DOB: [birth year]  REASON FOR VISIT: follow up HISTORY FROM: patient  Virtual Visit via Telephone Note  I connected with [place] on 01/18/23 at  1:30 PM EDT by telephone and verified that I am speaking with the correct person using two identifiers.   I discussed the limitations, risks, security and privacy concerns of performing an evaluation and management service by telephone and the availability of in person appointments. I also discussed with the patient that there may be a patient responsible charge related to this service. The patient expressed understanding and agreed to proceed.   History of Present Illness:  01/17/23 ALL ([place]):  Tina Solis is a 42 y.o. female here today for follow up for headaches. She was last seen by Dr Delena Bali and doing well on amitriptyline. Since, she reports doing well. She reports headaches are well managed. She rarely has a headache. She takes Tylenol a couple times a week but usually for generalized pain.   HISTORY (copied from Dr Quentin Mulling previous note)  42 year old female with a history of thyroid mass s/p right thyroidectomy, PTSD, anxiety, depression who follows in clinic for NDPH. MRI brain 10/31/21 with multiple small pineal cysts, otherwise unremarkable.   At her last visit she was started amitriptyline for headache prevention.   Interval History: Headaches have improved since her last visit. Amitriptyline 25 mg QHS has reduced to her headaches to less than once per week. Headaches are mild when she does get them and typically resolve with Tylenol.   She was started on hydroxychloroquine by Rheumatology and hopes this will help with her paresthesias and body pain. She was also started on Cymbalta 30 mg daily by her PCP which seems to have helped.   Headache days per month: 2 Headache free days per month: 28   Current Headache Regimen: Preventative: amitriptyline 25 mg QHS Abortive: Tylenol     Prior  Therapies                                  Excedrin Ibuprofen Amitriptyline 25 mg QHS Cymbalta 30 mg daily  Observations/Objective:  Generalized: Well developed, in no acute distress  Mentation: Alert oriented to time, place, history taking. Follows all commands speech and language fluent   Assessment and Plan:  42 y.o. year old female  has a past medical history of Bipolar depression ([facility]CC), Depression, Dizziness, and New persistent daily headache. here with    ICD-10-CM   1. New daily persistent headache  G44.52      She continues to do well. We will continue amitriptyline 25mg  at bedtime. We have discussed similarities with duloxetine and I have advised caution with dose adjustments or additional mood management medicaitons. She will monitor closely for unusual mood changes. She may continue Tylenol for abortive therapy. She will follow up with me in 1 year, sooner if needed.    No orders of the defined types were placed in this encounter.   No orders of the defined types were placed in this encounter.    Follow Up Instructions:  I discussed the assessment and treatment plan with the patient. The patient was provided an opportunity to ask questions and all were answered. The patient agreed with the plan and demonstrated an understanding of the instructions.   The patient was advised to call back or seek an in-person evaluation if the symptoms worsen or if the condition fails to  improve as anticipated.  I provided 15 minutes of non-face-to-face time during this encounter. Patient located at their place of residence during Mychart visit. Provider is in the office.    Shawnie Dapper, NP

## 2023-01-17 NOTE — Patient Instructions (Signed)
Below is our plan:  We will continue amitriptyline 25mg  at bedtime. Please be cautious with dose adjustments or additional mood management medications as you are already taking two different types of antidepressants. Continue Tylenol as needed for acute headache management.   Please make sure you are staying well hydrated. I recommend 50-60 ounces daily. Well balanced diet and regular exercise encouraged. Consistent sleep schedule with 6-8 hours recommended.   Please continue follow up with care team as directed.   Follow up with me in 1 year   You may receive a survey regarding today's visit. I encourage you to leave honest feed back as I do use this information to improve patient care. Thank you for seeing me today!   GENERAL HEADACHE INFORMATION:   Natural supplements: Magnesium Oxide or Magnesium Glycinate 500 mg at bed (up to 800 mg daily) Coenzyme Q10 300 mg in AM Vitamin B2- 200 mg twice a day   Add 1 supplement at a time since even natural supplements can have undesirable side effects. You can sometimes buy supplements cheaper (especially Coenzyme Q10) at www.WebmailGuide.co.za or at [facility].  Migraine with aura: There is increased risk for stroke in women with migraine with aura and a contraindication for the combined contraceptive pill for use by women who have migraine with aura. The risk for women with migraine without aura is lower. However other risk factors like smoking are far more likely to increase stroke risk than migraine. There is a recommendation for no smoking and for the use of OCPs without estrogen such as progestogen only pills particularly for women with migraine with aura.Marland Kitchen People who have migraine headaches with auras may be 3 times more likely to have a stroke caused by a blood clot, compared to migraine patients who don't see auras. Women who take hormone-replacement therapy may be 30 percent more likely to suffer a clot-based stroke than women not taking medication  containing estrogen. Other risk factors like smoking and high blood pressure may be  much more important.    Vitamins and herbs that show potential:   Magnesium: Magnesium (250 mg twice a day or 500 mg at bed) has a relaxant effect on smooth muscles such as blood vessels. Individuals suffering from frequent or daily headache usually have low magnesium levels which can be increase with daily supplementation of 400-750 mg. Three trials found 40-90% average headache reduction  when used as a preventative. Magnesium may help with headaches are aura, the best evidence for magnesium is for migraine with aura is its thought to stop the cortical spreading depression we believe is the pathophysiology of migraine aura.Magnesium also demonstrated the benefit in menstrually related migraine.  Magnesium is part of the messenger system in the serotonin cascade and it is a good muscle relaxant.  It is also useful for constipation which can be a side effect of other medications used to treat migraine. Good sources include nuts, whole grains, and tomatoes. Side Effects: loose stool/diarrhea  Riboflavin (vitamin B 2) 200 mg twice a day. This vitamin assists nerve cells in the production of ATP a principal energy storing molecule.  It is necessary for many chemical reactions in the body.  There have been at least 3 clinical trials of riboflavin using 400 mg per day all of which suggested that migraine frequency can be decreased.  All 3 trials showed significant improvement in over half of migraine sufferers.  The supplement is found in bread, cereal, milk, meat, and poultry.  Most Americans get more  riboflavin than the recommended daily allowance, however riboflavin deficiency is not necessary for the supplements to help prevent headache. Side effects: energizing, green urine   Coenzyme Q10: This is present in almost all cells in the body and is critical component for the conversion of energy.  Recent studies have shown that a  nutritional supplement of CoQ10 can reduce the frequency of migraine attacks by improving the energy production of cells as with riboflavin.  Doses of 150 mg twice a day have been shown to be effective.   Melatonin: Increasing evidence shows correlation between melatonin secretion and headache conditions.  Melatonin supplementation has decreased headache intensity and duration.  It is widely used as a sleep aid.  Sleep is natures way of dealing with migraine.  A dose of 3 mg is recommended to start for headaches including cluster headache. Higher doses up to 15 mg has been reviewed for use in Cluster headache and have been used. The rationale behind using melatonin for cluster is that many theories regarding the cause of Cluster headache center around the disruption of the normal circadian rhythm in the brain.  This helps restore the normal circadian rhythm.   HEADACHE DIET: Foods and beverages which may trigger migraine Note that only 20% of headache patients are food sensitive. You will know if you are food sensitive if you get a headache consistently 20 minutes to 2 hours after eating a certain food. Only cut out a food if it causes headaches, otherwise you might remove foods you enjoy! What matters most for diet is to eat a well balanced healthy diet full of vegetables and low fat protein, and to not miss meals.   Chocolate, other sweets ALL cheeses except cottage and cream cheese Dairy products, yogurt, sour cream, ice cream Liver Meat extracts (Bovril, Marmite, meat tenderizers) Meats or fish which have undergone aging, fermenting, pickling or smoking. These include: Hotdogs,salami,Lox,sausage, mortadellas,smoked salmon, pepperoni, Pickled herring Pods of broad bean (English beans, Chinese pea pods, [place] (fava) beans, lima and navy beans Ripe avocado, ripe banana Yeast extracts or active yeast preparations such as Brewer's or Fleishman's (commercial bakes goods are permitted) Tomato based  foods, pizza (lasagna, etc.)   MSG (monosodium glutamate) is disguised as many things; look for these common aliases: Monopotassium glutamate Autolysed yeast Hydrolysed protein Sodium caseinate "flavorings" "all natural preservatives" Nutrasweet   Avoid all other foods that convincingly provoke headaches.   Resources: The Dizzy [place] Laundry Your Headache Diet, migrainestrong.com  https://zamora-andrews.com/   Caffeine and Migraine For patients that have migraine, caffeine intake more than 3 days per week can lead to dependency and increased migraine frequency. I would recommend cutting back on your caffeine intake as best you can. The recommended amount of caffeine is 200-300 mg daily, although migraine patients may experience dependency at even lower doses. While you may notice an increase in headache temporarily, cutting back will be helpful for headaches in the long run. For more information on caffeine and migraine, visit: https://americanmigrainefoundation.org/resource-library/caffeine-and-migraine/   Headache Prevention Strategies:   1. Maintain a headache diary; learn to identify and avoid triggers.  - This can be a simple note where you log when you had a headache, associated symptoms, and medications used - There are several smartphone apps developed to help track migraines: Migraine Buddy, Migraine Monitor, Curelator N1-Headache App   Common triggers include: Emotional triggers: Emotional/Upset family or friends Emotional/Upset occupation Business reversal/success Anticipation anxiety Crisis-serious Post-crisis periodNew job/position   Physical triggers: Vacation Day Weekend Strenuous Exercise High  Altitude Location New Move Menstrual Day Physical Illness Oversleep/Not enough sleep Weather changes Light: Photophobia or light sesnitivity treatment involves a balance between desensitization and reduction in overly strong  input. Use dark polarized glasses outside, but not inside. Avoid bright or fluorescent light, but do not dim environment to the point that going into a normally lit room hurts. Consider FL-41 tint lenses, which reduce the most irritating wavelengths without blocking too much light.  These can be obtained at axonoptics.com or theraspecs.com Foods: see list above.   2. Limit use of acute treatments (over-the-counter medications, triptans, etc.) to no more than 2 days per week or 10 days per month to prevent medication overuse headache (rebound headache).     3. Follow a regular schedule (including weekends and holidays): Don't skip meals. Eat a balanced diet. 8 hours of sleep nightly. Minimize stress. Exercise 30 minutes per day. Being overweight is associated with a 5 times increased risk of chronic migraine. Keep well hydrated and drink 6-8 glasses of water per day.   4. Initiate non-pharmacologic measures at the earliest onset of your headache. Rest and quiet environment. Relax and reduce stress. Breathe2Relax is a free app that can instruct you on    some simple relaxtion and breathing techniques. Http://Dawnbuse.com is a    free website that provides teaching videos on relaxation.  Also, there are  many apps that   can be downloaded for "mindful" relaxation.  An app called YOGA NIDRA will help walk you through mindfulness. Another app called Calm can be downloaded to give you a structured mindfulness guide with daily reminders and skill development. Headspace for guided meditation Mindfulness Based Stress Reduction Online Course: www.palousemindfulness.com Cold compresses.   5. Don't wait!! Take the maximum allowable dosage of prescribed medication at the first sign of migraine.   6. Compliance:  Take prescribed medication regularly as directed and at the first sign of a migraine.   7. Communicate:  Call your physician when problems arise, especially if your headaches change, increase in  frequency/severity, or become associated with neurological symptoms (weakness, numbness, slurred speech, etc.). Proceed to emergency room if you experience new or worsening symptoms or symptoms do not resolve, if you have new neurologic symptoms or if headache is severe, or for any concerning symptom.   8. Headache/pain management therapies: Consider various complementary methods, including medication, behavioral therapy, psychological counselling, biofeedback, massage therapy, acupuncture, dry needling, and other modalities.  Such measures may reduce the need for medications. Counseling for pain management, where patients learn to function and ignore/minimize their pain, seems to work very well.   9. Recommend changing family's attention and focus away from patient's headaches. Instead, emphasize daily activities. If first question of day is 'How are your headaches/Do you have a headache today?', then patient will constantly think about headaches, thus making them worse. Goal is to re-direct attention away from headaches, toward daily activities and other distractions.   10. Helpful Websites: www.AmericanHeadacheSociety.org PatentHood.ch www.headaches.org TightMarket.nl www.achenet.org

## 2023-01-18 ENCOUNTER — Telehealth (INDEPENDENT_AMBULATORY_CARE_PROVIDER_SITE_OTHER): Payer: Managed Care, Other (non HMO) | Admitting: Family Medicine

## 2023-01-18 ENCOUNTER — Encounter: Payer: Self-pay | Admitting: Family Medicine

## 2023-01-18 DIAGNOSIS — G4452 New daily persistent headache (NDPH): Secondary | ICD-10-CM

## 2023-09-05 ENCOUNTER — Other Ambulatory Visit: Payer: Self-pay | Admitting: Internal Medicine

## 2023-09-05 DIAGNOSIS — Q6102 Congenital multiple renal cysts: Secondary | ICD-10-CM

## 2023-09-06 ENCOUNTER — Telehealth: Payer: Self-pay | Admitting: Family Medicine

## 2023-09-06 ENCOUNTER — Encounter: Payer: Self-pay | Admitting: Family Medicine

## 2023-09-06 NOTE — Telephone Encounter (Signed)
 LVM and sent letter in mail informing pt of need to reschedule 01/25/24 appt - NP out

## 2023-09-08 ENCOUNTER — Ambulatory Visit
Admission: RE | Admit: 2023-09-08 | Discharge: 2023-09-08 | Discharge status: Home / Self Care | Source: Ambulatory Visit | Attending: Internal Medicine | Admitting: Internal Medicine

## 2023-09-08 DIAGNOSIS — Q6102 Congenital multiple renal cysts: Secondary | ICD-10-CM

## 2024-01-25 ENCOUNTER — Telehealth: Payer: Managed Care, Other (non HMO) | Admitting: Family Medicine
